# Patient Record
Sex: Male | Born: 1965
Health system: Southern US, Community
[De-identification: ages and names within clinical notes are randomized; demographics above are authoritative.]

## PROBLEM LIST (undated history)

## (undated) DIAGNOSIS — J309 Allergic rhinitis, unspecified: Secondary | ICD-10-CM

## (undated) DIAGNOSIS — R51 Headache: Secondary | ICD-10-CM

## (undated) DIAGNOSIS — Z860100 Personal history of colon polyps, unspecified: Secondary | ICD-10-CM

## (undated) DIAGNOSIS — H409 Unspecified glaucoma: Secondary | ICD-10-CM

## (undated) DIAGNOSIS — I10 Essential (primary) hypertension: Secondary | ICD-10-CM

## (undated) DIAGNOSIS — R519 Headache, unspecified: Secondary | ICD-10-CM

## (undated) HISTORY — DX: Personal history of colon polyps, unspecified: Z86.0100

## (undated) HISTORY — DX: Headache, unspecified: R51.9

## (undated) HISTORY — PX: CYST REMOVAL NECK: SHX6281

## (undated) HISTORY — DX: Essential (primary) hypertension: I10

## (undated) HISTORY — DX: Personal history of colonic polyps: Z86.010

## (undated) HISTORY — DX: Allergic rhinitis, unspecified: J30.9

## (undated) HISTORY — DX: Headache: R51

## (undated) HISTORY — DX: Unspecified glaucoma: H40.9

---

## 2000-12-20 ENCOUNTER — Ambulatory Visit (HOSPITAL_COMMUNITY): Admission: RE | Admit: 2000-12-20 | Discharge: 2000-12-20 | Payer: Self-pay | Admitting: Internal Medicine

## 2000-12-20 ENCOUNTER — Encounter: Payer: Self-pay | Admitting: Internal Medicine

## 2002-12-20 ENCOUNTER — Encounter: Payer: Self-pay | Admitting: Internal Medicine

## 2002-12-20 ENCOUNTER — Encounter: Admission: RE | Admit: 2002-12-20 | Discharge: 2002-12-20 | Payer: Self-pay | Admitting: Internal Medicine

## 2004-09-18 ENCOUNTER — Ambulatory Visit: Payer: Self-pay | Admitting: Internal Medicine

## 2005-02-08 ENCOUNTER — Ambulatory Visit: Payer: Self-pay | Admitting: Internal Medicine

## 2005-03-21 ENCOUNTER — Ambulatory Visit: Payer: Self-pay | Admitting: Endocrinology

## 2005-10-23 ENCOUNTER — Ambulatory Visit: Payer: Self-pay | Admitting: Endocrinology

## 2005-10-29 ENCOUNTER — Ambulatory Visit: Payer: Self-pay | Admitting: Endocrinology

## 2005-12-18 ENCOUNTER — Ambulatory Visit: Payer: Self-pay | Admitting: Endocrinology

## 2007-01-06 ENCOUNTER — Ambulatory Visit: Payer: Self-pay | Admitting: Endocrinology

## 2007-01-17 ENCOUNTER — Ambulatory Visit: Payer: Self-pay | Admitting: Endocrinology

## 2007-01-17 LAB — CONVERTED CEMR LAB
ALT: 36 units/L (ref 0–40)
AST: 29 units/L (ref 0–37)
Albumin: 4.2 g/dL (ref 3.5–5.2)
Alkaline Phosphatase: 59 units/L (ref 39–117)
BUN: 15 mg/dL (ref 6–23)
Basophils Absolute: 0 10*3/uL (ref 0.0–0.1)
Basophils Relative: 0.2 % (ref 0.0–1.0)
Bilirubin Urine: NEGATIVE
Bilirubin, Direct: 0.2 mg/dL (ref 0.0–0.3)
CO2: 31 meq/L (ref 19–32)
Calcium: 9.4 mg/dL (ref 8.4–10.5)
Chloride: 107 meq/L (ref 96–112)
Cholesterol: 140 mg/dL (ref 0–200)
Creatinine, Ser: 1 mg/dL (ref 0.4–1.5)
Eosinophils Absolute: 0.2 10*3/uL (ref 0.0–0.6)
Eosinophils Relative: 3.1 % (ref 0.0–5.0)
GFR calc Af Amer: 106 mL/min
GFR calc non Af Amer: 88 mL/min
Glucose, Bld: 102 mg/dL — ABNORMAL HIGH (ref 70–99)
HCT: 43.4 % (ref 39.0–52.0)
HDL: 41.3 mg/dL (ref >39.0)
Hemoglobin, Urine: NEGATIVE
Hemoglobin: 15.1 g/dL (ref 13.0–17.0)
Ketones, ur: NEGATIVE mg/dL
LDL Cholesterol: 80 mg/dL (ref 0–99)
Leukocytes, UA: NEGATIVE
Lymphocytes Relative: 47.9 % — ABNORMAL HIGH (ref 12.0–46.0)
MCHC: 34.8 g/dL (ref 30.0–36.0)
MCV: 88.1 fL (ref 78.0–100.0)
Monocytes Absolute: 0.6 10*3/uL (ref 0.2–0.7)
Monocytes Relative: 11.3 % — ABNORMAL HIGH (ref 3.0–11.0)
Neutro Abs: 2 10*3/uL (ref 1.4–7.7)
Neutrophils Relative %: 37.5 % — ABNORMAL LOW (ref 43.0–77.0)
Nitrite: NEGATIVE
Platelets: 425 10*3/uL — ABNORMAL HIGH (ref 150–400)
Potassium: 4 meq/L (ref 3.5–5.1)
RBC: 4.93 M/uL (ref 4.22–5.81)
RDW: 11.6 % (ref 11.5–14.6)
Sodium: 142 meq/L (ref 135–145)
Specific Gravity, Urine: 1.015 (ref 1.000–1.03)
TSH: 1.26 microintl units/mL (ref 0.35–5.50)
Total Bilirubin: 1 mg/dL (ref 0.3–1.2)
Total CHOL/HDL Ratio: 3.4
Total Protein, Urine: NEGATIVE mg/dL
Total Protein: 7.1 g/dL (ref 6.0–8.3)
Triglycerides: 93 mg/dL (ref 0–149)
Urine Glucose: NEGATIVE mg/dL
Urobilinogen, UA: 0.2 (ref 0.0–1.0)
VLDL: 19 mg/dL (ref 0–40)
WBC: 5.2 10*3/uL (ref 4.5–10.5)
pH: 8 (ref 5.0–8.0)

## 2007-01-24 ENCOUNTER — Ambulatory Visit: Payer: Self-pay | Admitting: Endocrinology

## 2007-02-18 ENCOUNTER — Encounter: Admission: RE | Admit: 2007-02-18 | Discharge: 2007-02-18 | Payer: Self-pay | Admitting: Endocrinology

## 2007-04-04 ENCOUNTER — Ambulatory Visit: Payer: Self-pay | Admitting: Internal Medicine

## 2007-04-04 HISTORY — PX: ELECTROCARDIOGRAM: SHX264

## 2007-05-01 ENCOUNTER — Ambulatory Visit: Payer: Self-pay | Admitting: Endocrinology

## 2007-05-01 ENCOUNTER — Encounter: Admission: RE | Admit: 2007-05-01 | Discharge: 2007-05-01 | Payer: Self-pay | Admitting: Endocrinology

## 2007-05-01 LAB — CONVERTED CEMR LAB
BUN: 11 mg/dL (ref 6–23)
Basophils Absolute: 0.1 10*3/uL (ref 0.0–0.1)
Basophils Relative: 1 % (ref 0.0–1.0)
CO2: 29 meq/L (ref 19–32)
Calcium: 9.9 mg/dL (ref 8.4–10.5)
Chloride: 106 meq/L (ref 96–112)
Creatinine, Ser: 1 mg/dL (ref 0.4–1.5)
Eosinophils Absolute: 0.2 10*3/uL (ref 0.0–0.6)
Eosinophils Relative: 3.5 % (ref 0.0–5.0)
GFR calc Af Amer: 106 mL/min
GFR calc non Af Amer: 88 mL/min
Glucose, Bld: 105 mg/dL — ABNORMAL HIGH (ref 70–99)
HCT: 41.5 % (ref 39.0–52.0)
Hemoglobin: 14.4 g/dL (ref 13.0–17.0)
Lymphocytes Relative: 44.7 % (ref 12.0–46.0)
MCHC: 34.8 g/dL (ref 30.0–36.0)
MCV: 89 fL (ref 78.0–100.0)
Monocytes Absolute: 0.9 10*3/uL — ABNORMAL HIGH (ref 0.2–0.7)
Monocytes Relative: 13.6 % — ABNORMAL HIGH (ref 3.0–11.0)
Neutro Abs: 2.3 10*3/uL (ref 1.4–7.7)
Neutrophils Relative %: 37.2 % — ABNORMAL LOW (ref 43.0–77.0)
Platelets: 313 10*3/uL (ref 150–400)
Potassium: 4.6 meq/L (ref 3.5–5.1)
RBC: 4.66 M/uL (ref 4.22–5.81)
RDW: 11.8 % (ref 11.5–14.6)
Sed Rate: 11 mm/hr (ref 0–20)
Sodium: 142 meq/L (ref 135–145)
WBC: 6.3 10*3/uL (ref 4.5–10.5)

## 2007-05-08 ENCOUNTER — Encounter: Payer: Self-pay | Admitting: Endocrinology

## 2007-05-08 DIAGNOSIS — J309 Allergic rhinitis, unspecified: Secondary | ICD-10-CM

## 2007-05-08 HISTORY — DX: Allergic rhinitis, unspecified: J30.9

## 2007-05-09 ENCOUNTER — Ambulatory Visit: Payer: Self-pay | Admitting: Internal Medicine

## 2008-02-03 ENCOUNTER — Ambulatory Visit: Payer: Self-pay | Admitting: Endocrinology

## 2008-02-09 ENCOUNTER — Encounter: Payer: Self-pay | Admitting: Endocrinology

## 2008-08-31 ENCOUNTER — Ambulatory Visit: Payer: Self-pay | Admitting: Internal Medicine

## 2008-08-31 DIAGNOSIS — R030 Elevated blood-pressure reading, without diagnosis of hypertension: Secondary | ICD-10-CM

## 2009-03-25 ENCOUNTER — Ambulatory Visit: Payer: Self-pay | Admitting: Endocrinology

## 2009-03-26 LAB — CONVERTED CEMR LAB
ALT: 27 units/L (ref 0–53)
AST: 24 units/L (ref 0–37)
Albumin: 4.3 g/dL (ref 3.5–5.2)
Alkaline Phosphatase: 58 units/L (ref 39–117)
BUN: 15 mg/dL (ref 6–23)
Basophils Absolute: 0 10*3/uL (ref 0.0–0.1)
Basophils Relative: 0 % (ref 0.0–3.0)
Bilirubin Urine: NEGATIVE
Bilirubin, Direct: 0.2 mg/dL (ref 0.0–0.3)
CO2: 29 meq/L (ref 19–32)
Calcium: 9.5 mg/dL (ref 8.4–10.5)
Chloride: 102 meq/L (ref 96–112)
Cholesterol: 171 mg/dL (ref 0–200)
Creatinine, Ser: 1.1 mg/dL (ref 0.4–1.5)
Eosinophils Absolute: 0.2 10*3/uL (ref 0.0–0.7)
Eosinophils Relative: 3.9 % (ref 0.0–5.0)
GFR calc non Af Amer: 77.62 mL/min (ref >60)
Glucose, Bld: 94 mg/dL (ref 70–99)
HCT: 44.7 % (ref 39.0–52.0)
HDL: 50.8 mg/dL (ref >39.00)
Hemoglobin, Urine: NEGATIVE
Hemoglobin: 15.8 g/dL (ref 13.0–17.0)
Ketones, ur: NEGATIVE mg/dL
LDL Cholesterol: 98 mg/dL (ref 0–99)
Leukocytes, UA: NEGATIVE
Lymphocytes Relative: 41.3 % (ref 12.0–46.0)
Lymphs Abs: 2.1 10*3/uL (ref 0.7–4.0)
MCHC: 35.3 g/dL (ref 30.0–36.0)
MCV: 88.5 fL (ref 78.0–100.0)
Monocytes Absolute: 0.7 10*3/uL (ref 0.1–1.0)
Monocytes Relative: 13.6 % — ABNORMAL HIGH (ref 3.0–12.0)
Neutro Abs: 2.1 10*3/uL (ref 1.4–7.7)
Neutrophils Relative %: 41.2 % — ABNORMAL LOW (ref 43.0–77.0)
Nitrite: NEGATIVE
PSA: 0.52 ng/mL (ref 0.10–4.00)
Platelets: 271 10*3/uL (ref 150.0–400.0)
Potassium: 4 meq/L (ref 3.5–5.1)
RBC: 5.05 M/uL (ref 4.22–5.81)
RDW: 11.5 % (ref 11.5–14.6)
Sodium: 138 meq/L (ref 135–145)
Specific Gravity, Urine: 1.015 (ref 1.000–1.030)
TSH: 1.46 microintl units/mL (ref 0.35–5.50)
Total Bilirubin: 1.5 mg/dL — ABNORMAL HIGH (ref 0.3–1.2)
Total CHOL/HDL Ratio: 3
Total Protein, Urine: NEGATIVE mg/dL
Total Protein: 7.6 g/dL (ref 6.0–8.3)
Triglycerides: 113 mg/dL (ref 0.0–149.0)
Urine Glucose: NEGATIVE mg/dL
Urobilinogen, UA: 0.2 (ref 0.0–1.0)
VLDL: 22.6 mg/dL (ref 0.0–40.0)
WBC: 5.1 10*3/uL (ref 4.5–10.5)
pH: 6 (ref 5.0–8.0)

## 2009-04-01 ENCOUNTER — Ambulatory Visit: Payer: Self-pay | Admitting: Endocrinology

## 2009-04-01 DIAGNOSIS — H409 Unspecified glaucoma: Secondary | ICD-10-CM | POA: Insufficient documentation

## 2009-04-01 HISTORY — DX: Unspecified glaucoma: H40.9

## 2009-04-29 ENCOUNTER — Emergency Department (HOSPITAL_COMMUNITY): Admission: EM | Admit: 2009-04-29 | Discharge: 2009-04-29 | Payer: Self-pay | Admitting: Emergency Medicine

## 2009-08-15 ENCOUNTER — Encounter: Payer: Self-pay | Admitting: Endocrinology

## 2009-11-01 ENCOUNTER — Ambulatory Visit: Payer: Self-pay | Admitting: Internal Medicine

## 2009-11-01 LAB — CONVERTED CEMR LAB
Ketones, ur: NEGATIVE mg/dL
Leukocytes, UA: NEGATIVE
Specific Gravity, Urine: 1.02 (ref 1.000–1.030)
Urine Glucose: NEGATIVE mg/dL
Urobilinogen, UA: 0.2 (ref 0.0–1.0)
pH: 6.5 (ref 5.0–8.0)

## 2009-11-16 ENCOUNTER — Ambulatory Visit: Payer: Self-pay | Admitting: Internal Medicine

## 2009-11-16 LAB — CONVERTED CEMR LAB: Rapid Strep: NEGATIVE

## 2010-04-11 ENCOUNTER — Ambulatory Visit: Payer: Self-pay | Admitting: Endocrinology

## 2010-04-11 LAB — CONVERTED CEMR LAB
Basophils Absolute: 0.1 10*3/uL (ref 0.0–0.1)
Bilirubin Urine: NEGATIVE
Bilirubin, Direct: 0.1 mg/dL (ref 0.0–0.3)
CO2: 27 meq/L (ref 19–32)
Calcium: 9.4 mg/dL (ref 8.4–10.5)
Chloride: 108 meq/L (ref 96–112)
Cholesterol: 186 mg/dL (ref 0–200)
Creatinine, Ser: 1 mg/dL (ref 0.4–1.5)
Eosinophils Absolute: 0.3 10*3/uL (ref 0.0–0.7)
HCT: 45.6 % (ref 39.0–52.0)
HDL: 44 mg/dL (ref >39.00)
Leukocytes, UA: NEGATIVE
Lymphs Abs: 2.6 10*3/uL (ref 0.7–4.0)
MCV: 90.4 fL (ref 78.0–100.0)
Monocytes Absolute: 0.8 10*3/uL (ref 0.1–1.0)
Neutrophils Relative %: 35.8 % — ABNORMAL LOW (ref 43.0–77.0)
Nitrite: NEGATIVE
PSA: 0.57 ng/mL (ref 0.10–4.00)
Platelets: 294 10*3/uL (ref 150.0–400.0)
RDW: 12.4 % (ref 11.5–14.6)
Sodium: 139 meq/L (ref 135–145)
Specific Gravity, Urine: >=1.03 (ref 1.000–1.030)
TSH: 2.36 microintl units/mL (ref 0.35–5.50)
Total Bilirubin: 0.6 mg/dL (ref 0.3–1.2)
Total Protein, Urine: NEGATIVE mg/dL
Triglycerides: 288 mg/dL — ABNORMAL HIGH (ref 0.0–149.0)
WBC: 6 10*3/uL (ref 4.5–10.5)
pH: 5.5 (ref 5.0–8.0)

## 2010-04-13 ENCOUNTER — Ambulatory Visit: Payer: Self-pay | Admitting: Endocrinology

## 2010-11-09 NOTE — Assessment & Plan Note (Signed)
Summary: CPX/ [facility]   NEEDED IT BEFORE 7-8 /[facility]   Vital Signs:  Patient profile:   46 year old male Height:      73 inches (185.42 cm) Weight:      194.38 pounds (88.35 kg) BMI:     25.74 O2 Sat:      95 % on Room air Temp:     97.1 degrees F (36.17 degrees C) oral Pulse rate:   53 / minute BP sitting:   124 / 84  (left arm) Cuff size:   large  Vitals Entered By: Brenton Grills MA (April 13, 2010 8:01 AM)  O2 Flow:  Room air CC: CPX/pt has form to be filled out/aj   Primary Provider:  Minus Breeding MD  CC:  CPX/pt has form to be filled out/aj.  History of Present Illness: here for regular wellness examination.  He's feeling pretty well in general, and does not smoke.  alcohol is 1-2/day, but not every day.   Current Medications (verified): 1)  Travatan 0.004 %  Soln (Travoprost) .... Use 1 Drop in Each Eye Qd 2)  Claritin 10 Mg  Tabs (Loratadine) .... Take 1 By Mouth Qd  Allergies (verified): No Known Drug Allergies  Family History: Reviewed history from 08/31/2008 and no changes required. sister died with leukemia father with CABG at 21 yo  Social History: Reviewed history from 08/31/2008 and no changes required. Never Smoked Alcohol use-yes Married work - Hydrologist) - for [employer]  Review of Systems  The patient denies fever, weight loss, weight gain, vision loss, decreased hearing, chest pain, syncope, dyspnea on exertion, prolonged cough, headaches, abdominal pain, melena, hematochezia, severe indigestion/heartburn, hematuria, suspicious skin lesions, and depression.         denies decreased urinary stream  Physical Exam  General:  normal appearance.   Head:  head: no deformity eyes: no periorbital swelling, no proptosis external nose and ears are normal mouth: no lesion seen Neck:  Supple without thyroid enlargement or tenderness.  Lungs:  Clear to auscultation bilaterally. Normal respiratory effort.  Heart:  Regular rate and rhythm without  murmurs or gallops noted. Normal S1,S2.   Abdomen:  abdomen is soft, nontender.  no hepatosplenomegaly.   not distended.  no hernia  Genitalia:  Normal external male genitalia with no urethral discharge.  Msk:  muscle bulk and strength are grossly normal.  no obvious joint swelling.  gait is normal and steady  Pulses:  dorsalis pedis intact bilat.  no carotid bruit Extremities:  no deformity.  no ulcer on the feet.  feet are of normal color and temp.  no edema  Neurologic:  cn 2-12 grossly intact.   readily moves all 4's.   sensation is intact to touch on the feet  Skin:  normal texture and temp.  no rash.  not diaphoretic  Cervical Nodes:  No significant adenopathy.  Psych:  Alert and cooperative; normal mood and affect; normal attention span and concentration.     Impression & Recommendations:  Problem # 1:  ROUTINE GENERAL MEDICAL EXAM@HEALTH  CARE FACL (ICD-V70.0)  Other Orders: Est. Patient 40-64 years (16109)  Patient Instructions: 1)  please consider these measures for your health:  minimize alcohol.  do not use tobacco products.  have a colonoscopy at least every 10 years from age 16.  keep firearms safely stored.  always use seat belts.  have working smoke alarms in your home.  see the dentist regularly.  never drive under the influence  of alcohol or drugs (including prescription drugs).  those with fair skin should take precautions against the sun.

## 2010-11-09 NOTE — Assessment & Plan Note (Signed)
Summary: flu symptoms-lb   Vital Signs:  Patient profile:   45 year old male Height:      73 inches (185.42 cm) Weight:      195 pounds (88.64 kg) O2 Sat:      98 % on Room air Temp:     98.2 degrees F (36.78 degrees C) oral Pulse rate:   77 / minute BP sitting:   120 / 82  (left arm) Cuff size:   large  Vitals Entered By: Orlan Leavens (November 16, 2009 1:04 PM)  O2 Flow:  Room air CC: flu like symtoms Is Patient Diabetic? No Pain Assessment Patient in pain? no        Primary Care Provider:  Minus Breeding MD  CC:  flu like symtoms.  History of Present Illness: here today with complaint of fever and body ache. onset of symptoms was 36h ago (started [employer]). course has been gradual onset and now occurs with rapidly progressing pattern. problem precipitated by "tickle" in throat end of day Monday symptom characterized now as head congestion and fatigue - home early yesterday problem associated with sneezing, myalgias or high fever (102)  but not associated with sore throat, severe headache or rash. symptoms improved by use of tylenol and Sudafed. no prior hx of same symptoms.  took flu shot this year - son dx with strep throat this AM (rapid strep)  Current Medications (verified): 1)  Travatan 0.004 %  Soln (Travoprost) .... Use 1 Drop in Each Eye Qd 2)  Claritin 10 Mg  Tabs (Loratadine) .... Take 1 By Mouth Qd  Allergies (verified): No Known Drug Allergies  Past History:  Past Medical History: Reviewed history from 05/08/2007 and no changes required. Allergic rhinitis  Review of Systems       The patient complains of fever.  The patient denies hoarseness, chest pain, and syncope.    Physical Exam  General:  alert, well-developed, well-nourished, and cooperative to examination.   mildly ill Eyes:  vision grossly intact; pupils equal, round and reactive to light - watery conjunctiva but lids normal.    Ears:  normal pinnae bilaterally, without erythema,  swelling, or tenderness to palpation. TMs clear, without effusion, or cerumen impaction. Hearing grossly normal bilaterally  Mouth:  teeth and gums in good repair; mucous membranes moist, without lesions or ulcers. oropharynx clear without exudate, mod erythema.  Lungs:  normal respiratory effort, no intercostal retractions or use of accessory muscles; normal breath sounds bilaterally - no crackles and no wheezes.    Heart:  normal rate, regular rhythm, no murmur, and no rub. BLE without edema.  Neurologic:  alert & oriented X3 and cranial nerves II-XII symetrically intact.  strength normal in all extremities, sensation intact to light touch, and gait normal. speech fluent without dysarthria or aphasia; follows commands with good comprehension.  Skin:  no rashes, vesicles, ulcers, or erythema. No nodules or irregularity to palpation.    Impression & Recommendations:  Problem # 1:  FEVER UNSPECIFIED (ICD-780.60)  likely related to viral syndrome - see next-- due to +close family contacts with +strep - will check rapid strep to ensure no need for antibacterial--> NEG  Discussed fever control and symptomatic treatment.   Orders: Rapid Strep (54098)  Problem # 2:  URI (ICD-465.9)  ?flu - recieved vaccination against same but +typical symptoms and high fever - will tx emperically for flu with tamiflu cont tylenol/sudafed as needed for symptoms mgmt  His updated medication list for  this problem includes:    Claritin 10 Mg Tabs (Loratadine) .Marland Kitchen... Take 1 by mouth qd  Instructed on symptomatic treatment. Call if symptoms persist or worsen.   Complete Medication List: 1)  Travatan 0.004 % Soln (Travoprost) .... Use 1 drop in each eye qd 2)  Claritin 10 Mg Tabs (Loratadine) .... Take 1 by mouth qd 3)  Tamiflu 75 Mg Caps (Oseltamivir phosphate) .Marland Kitchen.. 1 by mouth two times a day x 5days  Patient Instructions: 1)  it was good to see you today.  2)  rapid strep is negative 3)  take tamiflu two  times a day x next 5 days for possible flu - may also continue with tylenol and sudafed as needed for other fever and congestion symptoms  4)  Get plenty of rest, drink lots of clear liquids, and use Tylenol for fever and comfort. Return in 7-10 days if you're not better,sooner if you're feeling worse. Prescriptions: TAMIFLU 75 MG CAPS (OSELTAMIVIR PHOSPHATE) 1 by mouth two times a day x 5days  #10 x 0   Entered and Authorized by:   Newt Lukes MD   Signed by:   Newt Lukes MD on 11/16/2009   Method used:   Print then Give to Patient   RxID:   [id number]   Laboratory Results    Other Tests  Rapid Strep: negative

## 2010-11-09 NOTE — Progress Notes (Signed)
Summary: Physical Exam Form/[facility] (patient)  Physical Exam Form/[facility] (patient)   Imported By: Sherian Rein 04/21/2010 08:43:32  _____________________________________________________________________  External Attachment:    Type:   Image     Comment:   External Document

## 2010-11-09 NOTE — Assessment & Plan Note (Signed)
Summary: AFTER EXERCISING GROIN PAIN--DR SAE PT/NO SLOT--STC   Vital Signs:  Patient profile:   45 year old male Height:      73 inches Weight:      195 pounds BMI:     25.82 O2 Sat:      97 % on Room air Temp:     97.1 degrees F oral Pulse rate:   58 / minute BP sitting:   112 / 72  (left arm) Cuff size:   regular  Vitals Entered ByZella Ball Ewing (November 01, 2009 3:55 PM)  O2 Flow:  Room air  CC: groin pain when exercising/RE   CC:  groin pain when exercising/RE.  History of Present Illness: here with dull pain recurrent to the perineal area just post to the scrotum for 4 wks intermittent only after significant excercise such as basketball or jogging  2 miles , which he does several times per wk.  No abd pain, n/v, fever, GU symptoms such as freq, urgency, blood or dysuria, or GI such as constipation or diarrhea or blood;  no analrectal pain.  Pain will typically wane over 2 days and seems better with ice after excercise and 2 alleve.  No prior hx of this in the past, including no hx of prostatitis.  no recent back pain, wt loss, night sweats.    Problems Prior to Update: 1)  Pelvic Pain  (ICD-789.09) 2)  Routine General Medical Exam@health  Care Facl  (ICD-V70.0) 3)  Glaucoma  (ICD-365.9) 4)  Elevated Blood Pressure Without Diagnosis of Hypertension  (ICD-796.2) 5)  Foot Pain, Right  (ICD-729.5) 6)  Allergic Rhinitis  (ICD-477.9)  Medications Prior to Update: 1)  Travatan 0.004 %  Soln (Travoprost) .... Use 1 Drop in Each Eye Qd 2)  Claritin 10 Mg  Tabs (Loratadine) .... Take 1 By Mouth Qd  Current Medications (verified): 1)  Travatan 0.004 %  Soln (Travoprost) .... Use 1 Drop in Each Eye Qd 2)  Claritin 10 Mg  Tabs (Loratadine) .... 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   Head:  normocephalic and atraumatic.   Eyes:  vision grossly intact, pupils equal, and pupils round.   Ears:  R ear normal and L ear normal.   Nose:  no external deformity and no nasal discharge.   Mouth:  no gingival abnormalities and pharynx pink and moist.   Neck:  supple and no masses.   Lungs:  normal respiratory effort and normal breath sounds.   Heart:  normal rate and regular rhythm.   Abdomen:  soft, non-tender, and normal bowel sounds.   Rectal:  No external abnormalities noted. Normal sphincter tone. No rectal masses or tenderness.  No perineal tender, erythema or swelling. Genitalia:  Testes bilaterally descended without nodularity, tenderness or masses. No scrotal masses or lesions. No penis lesions or urethral discharge.   Impression & Recommendations:  Problem # 1:  PELVIC  PAIN (ICD-789.09)  prob msk strain (pelvic floor muscles) recurrent by hx and benign exam, will check urine studies but exam bening, ok  for alleve OTC as he does as needed ; f/u for any worsening s/s  Orders: T-Culture, Urine ([id number]) TLB-Udip w/ Micro (81001-URINE)  Complete Medication List: 1)  Travatan 0.004 % Soln (Travoprost) .... Use 1 drop in each eye qd 2)  Claritin 10 Mg Tabs (Loratadine) .... Take 1 by mouth qd  Patient Instructions: 1)  Please go to the Lab in the basement for your urine tests today  2)  Continue all previous medications as before this visit  3)  Please schedule a follow-up appointment as needed.

## 2010-12-01 ENCOUNTER — Ambulatory Visit: Payer: Self-pay | Admitting: Family Medicine

## 2010-12-07 ENCOUNTER — Ambulatory Visit: Payer: Self-pay | Admitting: Endocrinology

## 2011-02-20 NOTE — Assessment & Plan Note (Signed)
[facility]                                 ON-CALL NOTE   Micheal Lawson, Micheal Lawson                       MRN:          [id number]  DATE:05/01/2007                            DOB:          Oct 27, 1965    TIME OF CALL:  May 01, 2007 at 5:36 p.m.   PHONE NUMBER:  [phone]   CALLER:  Gene at [facility].   OBJECTIVE:  The patient had a brain scan done.  Has had a constant  headache for 2 days.  This scan was read as normal.  The office will get  a copy of this dictation.  I presume that they will call the patient as  I do not have his phone number or identification.   PRIMARY CARE Rumaisa Schnetzer:  Dr. Everardo All.  Home office is [facility].     Arta Silence, MD  Electronically Signed    RNS/MedQ  DD: 05/01/2007  DT: 05/02/2007  Job #: [id number]

## 2012-02-22 ENCOUNTER — Ambulatory Visit (INDEPENDENT_AMBULATORY_CARE_PROVIDER_SITE_OTHER): Payer: Self-pay | Admitting: Endocrinology

## 2012-02-22 ENCOUNTER — Encounter: Payer: Self-pay | Admitting: Endocrinology

## 2012-02-22 VITALS — BP 142/94 | HR 69 | Temp 97.6°F | Ht 73.0 in | Wt 200.0 lb

## 2012-02-22 DIAGNOSIS — Z0000 Encounter for general adult medical examination without abnormal findings: Secondary | ICD-10-CM | POA: Insufficient documentation

## 2012-02-22 DIAGNOSIS — R030 Elevated blood-pressure reading, without diagnosis of hypertension: Secondary | ICD-10-CM

## 2012-02-22 DIAGNOSIS — Z125 Encounter for screening for malignant neoplasm of prostate: Secondary | ICD-10-CM

## 2012-02-22 NOTE — Patient Instructions (Addendum)
please consider these measures for your health:  minimize alcohol.  do not use tobacco products.  have a colonoscopy at least every 10 years from age 46.  keep firearms safely stored.  always use seat belts.  have working smoke alarms in your home.  see an eye doctor and dentist regularly.  never drive under the influence of alcohol or drugs (including prescription drugs).  those with fair skin should take precautions against the sun.   blood tests are being requested for you today.  You will receive a letter with results.   Please come back for a blood-pressure check in the next few weeks.

## 2012-02-22 NOTE — Progress Notes (Signed)
Subjective:    Patient ID: Micheal Lawson, male    DOB: [birth year], 46 y.o.   MRN: [id number]  HPI here for regular wellness examination.  He's feeling pretty well in general, and says chronic med probs are stable. Past Medical History  Diagnosis Date  . ALLERGIC RHINITIS 05/08/2007    Qualifier: Diagnosis of  By: Charlsie Quest RMA, [name]    . ELEVATED BLOOD PRESSURE WITHOUT DIAGNOSIS OF HYPERTENSION 08/31/2008    Qualifier: Diagnosis of  By: Jonny Ruiz MD, Len Blalock   . GLAUCOMA 04/01/2009    Qualifier: Diagnosis of  By: Everardo All MD, Cleophas Dunker     Past Surgical History  Procedure Date  . Electrocardiogram 04/04/2007    History   Social History  . Marital Status: Married    Spouse Name: N/A    Number of Children: N/A  . Years of Education: N/A   Occupational History  . IT Research scientist (physical sciences)) for [employer]   Social History Main Topics  . Smoking status: Never Smoker   . Smokeless tobacco: Not on file  . Alcohol Use: Yes  . Drug Use: Not on file  . Sexually Active: Not on file   Other Topics Concern  . Not on file   Social History Narrative  . No narrative on file    Current Outpatient Prescriptions on File Prior to Visit  Medication Sig Dispense Refill  . cetirizine (ZYRTEC) 10 MG tablet Take 10 mg by mouth daily.        No Known Allergies  Family History  Problem Relation Age of Onset  . Heart disease Father     CABG 28  . Leukemia Sister     BP 142/94  Pulse 69  Temp(Src) 97.6 F (36.4 C) (Oral)  Ht 6\' 1"  (1.854 m)  Wt 200 lb (90.719 kg)  BMI 26.39 kg/m2  SpO2 97%     Review of Systems  Constitutional: Negative for fever and unexpected weight change.  HENT: Negative for hearing loss.   Eyes: Negative for visual disturbance.  Respiratory: Negative for shortness of breath.   Cardiovascular: Negative for chest pain.  Gastrointestinal: Negative for abdominal pain and anal bleeding.  Genitourinary: Negative for hematuria and difficulty urinating.  Musculoskeletal:  Negative for back pain.  Skin: Negative for rash.  Neurological: Negative for syncope and headaches.  Hematological: Does not bruise/bleed easily.  Psychiatric/Behavioral: Negative for dysphoric mood.       Objective:   Physical Exam VS: see vs page GEN: no distress HEAD: head: no deformity eyes: no periorbital swelling, no proptosis external nose and ears are normal mouth: no lesion seen NECK: supple, thyroid is not enlarged CHEST WALL: no deformity LUNGS: clear to auscultation BREASTS:  No gynecomastia CV: reg rate and rhythm, no murmur ABD: abdomen is soft, nontender.  no hepatosplenomegaly.  not distended.  no hernia GENITALIA:  Normal male.   MUSCULOSKELETAL: muscle bulk and strength are grossly normal.  no obvious joint swelling.  gait is normal and steady EXTEMITIES: no deformity.  no ulcer on the feet.  feet are of normal color and temp.  no edema PULSES: dorsalis pedis intact bilat.  no carotid bruit NEURO:  cn 2-12 grossly intact.   readily moves all 4's.  sensation is intact to touch on the feet SKIN:  Normal texture and temperature.  No rash or suspicious lesion is visible.   NODES:  None palpable at the neck PSYCH: alert, oriented x3.  Does not appear anxious nor depressed.  Assessment & Plan:  Wellness visit today, with problems stable  1 skin tag removed from right shoulder area

## 2012-02-26 ENCOUNTER — Other Ambulatory Visit (INDEPENDENT_AMBULATORY_CARE_PROVIDER_SITE_OTHER): Payer: 59

## 2012-02-26 DIAGNOSIS — Z0000 Encounter for general adult medical examination without abnormal findings: Secondary | ICD-10-CM

## 2012-02-26 DIAGNOSIS — Z125 Encounter for screening for malignant neoplasm of prostate: Secondary | ICD-10-CM

## 2012-02-26 DIAGNOSIS — R030 Elevated blood-pressure reading, without diagnosis of hypertension: Secondary | ICD-10-CM

## 2012-02-26 LAB — LIPID PANEL
HDL: 34.9 mg/dL — ABNORMAL LOW (ref >39.00)
LDL Cholesterol: 71 mg/dL (ref 0–99)
Total CHOL/HDL Ratio: 4
Triglycerides: 88 mg/dL (ref 0.0–149.0)
VLDL: 17.6 mg/dL (ref 0.0–40.0)

## 2012-02-26 LAB — URINALYSIS, ROUTINE W REFLEX MICROSCOPIC
Bilirubin Urine: NEGATIVE
Ketones, ur: NEGATIVE
Leukocytes, UA: NEGATIVE
pH: 5.5 (ref 5.0–8.0)

## 2012-02-26 LAB — CBC WITH DIFFERENTIAL/PLATELET
Basophils Relative: 0.6 % (ref 0.0–3.0)
Eosinophils Absolute: 0.3 10*3/uL (ref 0.0–0.7)
Eosinophils Relative: 5.4 % — ABNORMAL HIGH (ref 0.0–5.0)
Hemoglobin: 14.9 g/dL (ref 13.0–17.0)
Lymphocytes Relative: 39.1 % (ref 12.0–46.0)
MCHC: 34 g/dL (ref 30.0–36.0)
Monocytes Relative: 17.9 % — ABNORMAL HIGH (ref 3.0–12.0)
Neutro Abs: 1.9 10*3/uL (ref 1.4–7.7)
RBC: 4.89 Mil/uL (ref 4.22–5.81)

## 2012-02-26 LAB — PSA: PSA: 0.6 ng/mL (ref 0.10–4.00)

## 2012-02-26 LAB — BASIC METABOLIC PANEL
CO2: 25 mEq/L (ref 19–32)
Calcium: 8.9 mg/dL (ref 8.4–10.5)
Sodium: 139 mEq/L (ref 135–145)

## 2012-02-26 LAB — HEPATIC FUNCTION PANEL
Albumin: 4 g/dL (ref 3.5–5.2)
Alkaline Phosphatase: 64 U/L (ref 39–117)

## 2012-02-26 LAB — TSH: TSH: 2.85 u[IU]/mL (ref 0.35–5.50)

## 2012-02-27 ENCOUNTER — Encounter: Payer: Self-pay | Admitting: Endocrinology

## 2012-02-28 ENCOUNTER — Telehealth: Payer: Self-pay | Admitting: *Deleted

## 2012-02-28 NOTE — Telephone Encounter (Signed)
Called pt to inform of lab results, left message for pt to callback office (letter also mailed to pt). 

## 2012-03-04 NOTE — Telephone Encounter (Signed)
Left VM informing pt of results and to callback office with any questions/concerns.

## 2012-08-08 ENCOUNTER — Ambulatory Visit (INDEPENDENT_AMBULATORY_CARE_PROVIDER_SITE_OTHER): Payer: 59 | Admitting: Endocrinology

## 2012-08-08 ENCOUNTER — Encounter: Payer: Self-pay | Admitting: Endocrinology

## 2012-08-08 ENCOUNTER — Ambulatory Visit
Admission: RE | Admit: 2012-08-08 | Discharge: 2012-08-08 | Disposition: A | Discharge status: Home / Self Care | Payer: 59 | Source: Ambulatory Visit | Attending: Endocrinology | Admitting: Endocrinology

## 2012-08-08 VITALS — BP 122/80 | HR 80 | Temp 98.2°F | Wt 192.0 lb

## 2012-08-08 DIAGNOSIS — M25511 Pain in right shoulder: Secondary | ICD-10-CM

## 2012-08-08 DIAGNOSIS — M25519 Pain in unspecified shoulder: Secondary | ICD-10-CM

## 2012-08-08 NOTE — Patient Instructions (Addendum)
Refer to an orthopedic specialist.  you will receive a phone call, about a day and time for an appointment.   Let's check a x-ray.  You will be contacted with results.

## 2012-08-08 NOTE — Progress Notes (Signed)
  Subjective:    Patient ID: Micheal Lawson, male    DOB: 03-16-1966, 46 y.o.   MRN: [id number]  HPI Pt states 8 mos of intermittent moderate pain at the right shoulder.  He has assoc pain at the right forearm.  He is unable to cite precip factor, such as local injury.   Past Medical History  Diagnosis Date  . ALLERGIC RHINITIS 05/08/2007    Qualifier: Diagnosis of  By: Charlsie Quest RMA, [name]    . ELEVATED BLOOD PRESSURE WITHOUT DIAGNOSIS OF HYPERTENSION 08/31/2008    Qualifier: Diagnosis of  By: Jonny Ruiz MD, Len Blalock   . GLAUCOMA 04/01/2009    Qualifier: Diagnosis of  By: Everardo All MD, Cleophas Dunker     Past Surgical History  Procedure Date  . Electrocardiogram 04/04/2007    History   Social History  . Marital Status: Married    Spouse Name: N/A    Number of Children: N/A  . Years of Education: N/A   Occupational History  . IT Research scientist (physical sciences)) for [employer]   Social History Main Topics  . Smoking status: Never Smoker   . Smokeless tobacco: Not on file  . Alcohol Use: Yes  . Drug Use: Not on file  . Sexually Active: Not on file   Other Topics Concern  . Not on file   Social History Narrative  . No narrative on file    Current Outpatient Prescriptions on File Prior to Visit  Medication Sig Dispense Refill  . Bromfenac Sodium (BROMDAY) 0.09 % SOLN Apply 1 drop to eye 2 (two) times daily.      . cetirizine (ZYRTEC) 10 MG tablet Take 10 mg by mouth daily.      . travoprost, benzalkonium, (TRAVATAN) 0.004 % ophthalmic solution Place 1 drop into both eyes 2 (two) times daily.        No Known Allergies  Family History  Problem Relation Age of Onset  . Heart disease Father     CABG 37  . Leukemia Sister     BP 122/80  Pulse 80  Temp 98.2 F (36.8 C) (Oral)  Wt 192 lb (87.091 kg)  SpO2 98%  Review of Systems Denies numbness and rash.      Objective:   Physical Exam VITAL SIGNS:  See vs page GENERAL: no distress Right shoulder: full rom without pain, except reaching  posteriorly is painful.   RUE: otherwise normal.  Neuro: sensation is intact to touch.     (i reviewed x-ray result)    Assessment & Plan:  Shoulder pain, new, uncertain etiology

## 2012-09-08 ENCOUNTER — Other Ambulatory Visit (HOSPITAL_COMMUNITY): Payer: Self-pay | Admitting: Orthopedic Surgery

## 2012-09-08 DIAGNOSIS — M25511 Pain in right shoulder: Secondary | ICD-10-CM

## 2012-09-10 ENCOUNTER — Ambulatory Visit (HOSPITAL_COMMUNITY)
Admission: RE | Admit: 2012-09-10 | Discharge: 2012-09-10 | Disposition: A | Discharge status: Home / Self Care | Payer: 59 | Source: Ambulatory Visit | Attending: Orthopedic Surgery | Admitting: Orthopedic Surgery

## 2012-09-10 DIAGNOSIS — M719 Bursopathy, unspecified: Secondary | ICD-10-CM | POA: Insufficient documentation

## 2012-09-10 DIAGNOSIS — M67919 Unspecified disorder of synovium and tendon, unspecified shoulder: Secondary | ICD-10-CM | POA: Insufficient documentation

## 2012-09-10 DIAGNOSIS — M25511 Pain in right shoulder: Secondary | ICD-10-CM

## 2012-09-10 DIAGNOSIS — M19019 Primary osteoarthritis, unspecified shoulder: Secondary | ICD-10-CM | POA: Insufficient documentation

## 2012-09-10 DIAGNOSIS — M25519 Pain in unspecified shoulder: Secondary | ICD-10-CM | POA: Insufficient documentation

## 2012-09-16 ENCOUNTER — Encounter (HOSPITAL_COMMUNITY): Payer: Self-pay | Admitting: Pharmacy Technician

## 2012-09-18 ENCOUNTER — Other Ambulatory Visit: Payer: Self-pay | Admitting: Physician Assistant

## 2012-09-22 NOTE — Pre-Procedure Instructions (Signed)
[name]  09/22/2012   Your procedure is scheduled on:  09-26-2012  Report to [facility] at 8:00 AM.  Take Charlotta Newton to the 3rd floor  Call this number if you have problems the morning of surgery: [phone]   Remember:   Do not eat food or drink:After Midnight.      Take these medicines the morning of surgery with A SIP OF WATER: eye drops as directed,certirizine(Zyrtec)   Do not wear jewelry,  Do not wear lotions, powders, or perfumes.  Do not shave 48 hours prior to surgery. Men may shave face and neck.  Do not bring valuables to the hospital.  Contacts, dentures or bridgework may not be worn into surgery.  Leave suitcase in the car. After surgery it may be brought to your room.   For patients admitted to the hospital, checkout time is 11:00 AM the day of discharge.   Patients discharged the day of surgery will not be allowed to drive home.  Name and phone number of your driver: ______________________   Special Instructions: Shower using CHG 2 nights before surgery and the night before surgery.  If you shower the day of surgery use CHG.  Use special wash - you have one bottle of CHG for all showers.  You should use approximately 1/3 of the bottle for each shower.    Please read over the following fact sheets that you were given: Pain Booklet, Coughing and Deep Breathing, MRSA Information and Surgical Site Infection Prevention

## 2012-09-23 ENCOUNTER — Encounter (HOSPITAL_COMMUNITY): Payer: Self-pay

## 2012-09-23 ENCOUNTER — Encounter (HOSPITAL_COMMUNITY)
Admission: RE | Admit: 2012-09-23 | Discharge: 2012-09-23 | Disposition: A | Discharge status: Home / Self Care | Payer: 59 | Source: Ambulatory Visit | Attending: Orthopedic Surgery | Admitting: Orthopedic Surgery

## 2012-09-23 LAB — CBC
MCHC: 35.7 g/dL (ref 30.0–36.0)
RDW: 11.9 % (ref 11.5–15.5)

## 2012-09-23 LAB — BASIC METABOLIC PANEL WITH GFR
BUN: 13 mg/dL (ref 6–23)
CO2: 25 meq/L (ref 19–32)
Calcium: 9.7 mg/dL (ref 8.4–10.5)
Chloride: 105 meq/L (ref 96–112)
Creatinine, Ser: 0.95 mg/dL (ref 0.50–1.35)
GFR calc Af Amer: >90 mL/min
GFR calc non Af Amer: >90 mL/min
Glucose, Bld: 88 mg/dL (ref 70–99)
Potassium: 4 meq/L (ref 3.5–5.1)
Sodium: 140 meq/L (ref 135–145)

## 2012-09-23 LAB — SURGICAL PCR SCREEN
MRSA, PCR: NEGATIVE
Staphylococcus aureus: NEGATIVE

## 2012-09-23 MED ORDER — SODIUM CHLORIDE 0.9 % IV SOLN: INTRAVENOUS | Status: DC

## 2012-09-25 MED ORDER — CEFAZOLIN SODIUM-DEXTROSE 2-3 GM-% IV SOLR
2.0000 g | INTRAVENOUS | Status: AC
  Administered 2012-09-26: 2 g via INTRAVENOUS
  Filled 2012-09-25: qty 50

## 2012-09-26 ENCOUNTER — Ambulatory Visit (HOSPITAL_COMMUNITY)
Admission: RE | Admit: 2012-09-26 | Discharge: 2012-09-26 | Disposition: A | Discharge status: Home / Self Care | Payer: 59 | Source: Ambulatory Visit | Attending: Orthopedic Surgery | Admitting: Orthopedic Surgery

## 2012-09-26 ENCOUNTER — Encounter (HOSPITAL_COMMUNITY)
Admission: RE | Disposition: A | Discharge status: Home / Self Care | Payer: Self-pay | Source: Ambulatory Visit | Attending: Orthopedic Surgery

## 2012-09-26 ENCOUNTER — Encounter (HOSPITAL_COMMUNITY): Payer: Self-pay | Admitting: Anesthesiology

## 2012-09-26 ENCOUNTER — Ambulatory Visit (HOSPITAL_COMMUNITY): Payer: 59 | Admitting: Anesthesiology

## 2012-09-26 DIAGNOSIS — J309 Allergic rhinitis, unspecified: Secondary | ICD-10-CM | POA: Insufficient documentation

## 2012-09-26 DIAGNOSIS — Z806 Family history of leukemia: Secondary | ICD-10-CM | POA: Insufficient documentation

## 2012-09-26 DIAGNOSIS — Z7982 Long term (current) use of aspirin: Secondary | ICD-10-CM | POA: Insufficient documentation

## 2012-09-26 DIAGNOSIS — R030 Elevated blood-pressure reading, without diagnosis of hypertension: Secondary | ICD-10-CM | POA: Insufficient documentation

## 2012-09-26 DIAGNOSIS — M24819 Other specific joint derangements of unspecified shoulder, not elsewhere classified: Secondary | ICD-10-CM | POA: Insufficient documentation

## 2012-09-26 DIAGNOSIS — M19019 Primary osteoarthritis, unspecified shoulder: Secondary | ICD-10-CM | POA: Insufficient documentation

## 2012-09-26 DIAGNOSIS — H409 Unspecified glaucoma: Secondary | ICD-10-CM | POA: Insufficient documentation

## 2012-09-26 DIAGNOSIS — Z8249 Family history of ischemic heart disease and other diseases of the circulatory system: Secondary | ICD-10-CM | POA: Insufficient documentation

## 2012-09-26 DIAGNOSIS — M25819 Other specified joint disorders, unspecified shoulder: Secondary | ICD-10-CM | POA: Insufficient documentation

## 2012-09-26 HISTORY — PX: SHOULDER ARTHROSCOPY WITH ROTATOR CUFF REPAIR AND SUBACROMIAL DECOMPRESSION: SHX5686

## 2012-09-26 SURGERY — SHOULDER ARTHROSCOPY WITH ROTATOR CUFF REPAIR AND SUBACROMIAL DECOMPRESSION
Anesthesia: General | Site: Shoulder | Laterality: Right | Wound class: Clean

## 2012-09-26 MED ORDER — GLYCOPYRROLATE 0.2 MG/ML IJ SOLN
INTRAMUSCULAR | Status: DC | PRN
  Administered 2012-09-26: 0.4 mg via INTRAVENOUS

## 2012-09-26 MED ORDER — PROPOFOL 10 MG/ML IV BOLUS
INTRAVENOUS | Status: DC | PRN
  Administered 2012-09-26: 180 mg via INTRAVENOUS

## 2012-09-26 MED ORDER — MIDAZOLAM HCL 2 MG/2ML IJ SOLN
2.0000 mg | INTRAMUSCULAR | Status: DC | PRN
  Administered 2012-09-26: 2 mg via INTRAVENOUS

## 2012-09-26 MED ORDER — NEOSTIGMINE METHYLSULFATE 1 MG/ML IJ SOLN
INTRAMUSCULAR | Status: DC | PRN
  Administered 2012-09-26: 3 mg via INTRAVENOUS

## 2012-09-26 MED ORDER — LACTATED RINGERS IV SOLN
INTRAVENOUS | Status: DC | PRN
  Administered 2012-09-26 (×2): via INTRAVENOUS

## 2012-09-26 MED ORDER — ACETAMINOPHEN 10 MG/ML IV SOLN
INTRAVENOUS | Status: AC
  Filled 2012-09-26: qty 100

## 2012-09-26 MED ORDER — ONDANSETRON HCL 4 MG/2ML IJ SOLN
INTRAMUSCULAR | Status: DC | PRN
  Administered 2012-09-26: 4 mg via INTRAVENOUS

## 2012-09-26 MED ORDER — FENTANYL CITRATE 0.05 MG/ML IJ SOLN
INTRAMUSCULAR | Status: AC
  Filled 2012-09-26: qty 2

## 2012-09-26 MED ORDER — MIDAZOLAM HCL 2 MG/2ML IJ SOLN
INTRAMUSCULAR | Status: AC
  Filled 2012-09-26: qty 2

## 2012-09-26 MED ORDER — FENTANYL CITRATE 0.05 MG/ML IJ SOLN
INTRAMUSCULAR | Status: DC | PRN
  Administered 2012-09-26: 100 ug via INTRAVENOUS

## 2012-09-26 MED ORDER — ONDANSETRON HCL 4 MG/2ML IJ SOLN: 4.0000 mg | Freq: Once | INTRAMUSCULAR | Status: DC | PRN

## 2012-09-26 MED ORDER — SODIUM CHLORIDE 0.9 % IR SOLN
Status: DC | PRN
  Administered 2012-09-26: 6000 mL

## 2012-09-26 MED ORDER — DEXTROSE 5 % IV SOLN
INTRAVENOUS | Status: DC | PRN
  Administered 2012-09-26: 10:00:00 via INTRAVENOUS

## 2012-09-26 MED ORDER — HYDROMORPHONE HCL PF 1 MG/ML IJ SOLN
INTRAMUSCULAR | Status: AC
  Filled 2012-09-26: qty 1

## 2012-09-26 MED ORDER — LACTATED RINGERS IV SOLN
INTRAVENOUS | Status: DC
  Administered 2012-09-26: 10:00:00 via INTRAVENOUS

## 2012-09-26 MED ORDER — HYDROMORPHONE HCL PF 1 MG/ML IJ SOLN
0.2500 mg | INTRAMUSCULAR | Status: DC | PRN
  Administered 2012-09-26 (×3): 0.5 mg via INTRAVENOUS

## 2012-09-26 MED ORDER — CHLORHEXIDINE GLUCONATE 4 % EX LIQD: 60.0000 mL | Freq: Once | CUTANEOUS | Status: DC

## 2012-09-26 MED ORDER — SODIUM CHLORIDE 0.9 % IV SOLN: INTRAVENOUS | Status: DC

## 2012-09-26 MED ORDER — ROCURONIUM BROMIDE 100 MG/10ML IV SOLN
INTRAVENOUS | Status: DC | PRN
  Administered 2012-09-26: 50 mg via INTRAVENOUS

## 2012-09-26 MED ORDER — ARTIFICIAL TEARS OP OINT
TOPICAL_OINTMENT | OPHTHALMIC | Status: DC | PRN
  Administered 2012-09-26: 1 via OPHTHALMIC

## 2012-09-26 MED ORDER — FENTANYL CITRATE 0.05 MG/ML IJ SOLN
100.0000 ug | Freq: Once | INTRAMUSCULAR | Status: AC
  Administered 2012-09-26: 100 ug via INTRAVENOUS

## 2012-09-26 MED ORDER — LIDOCAINE HCL (CARDIAC) 20 MG/ML IV SOLN
INTRAVENOUS | Status: DC | PRN
  Administered 2012-09-26: 40 mg via INTRAVENOUS

## 2012-09-26 MED ORDER — ACETAMINOPHEN 10 MG/ML IV SOLN
1000.0000 mg | Freq: Once | INTRAVENOUS | Status: AC | PRN
  Administered 2012-09-26: 1000 mg via INTRAVENOUS

## 2012-09-26 SURGICAL SUPPLY — 42 items
BIT DRILL TAK (DRILL) IMPLANT
BLADE CUDA 5.5 (BLADE) IMPLANT
BLADE GREAT WHITE 4.2 (BLADE) ×2 IMPLANT
BLADE SURG 11 STRL SS (BLADE) ×2 IMPLANT
BUR OVAL 6.0 (BURR) ×2 IMPLANT
CANNULA SHOULDER 7CM (CANNULA) ×2 IMPLANT
CLOTH BEACON ORANGE TIMEOUT ST (SAFETY) ×2 IMPLANT
DRAPE STERI 35X30 U-POUCH (DRAPES) ×2 IMPLANT
DRAPE SURG 17X23 STRL (DRAPES) ×2 IMPLANT
DRAPE U-SHAPE 47X51 STRL (DRAPES) ×2 IMPLANT
DRILL TAK (DRILL)
DRSG ADAPTIC 3X8 NADH LF (GAUZE/BANDAGES/DRESSINGS) ×2 IMPLANT
DRSG PAD ABDOMINAL 8X10 ST (GAUZE/BANDAGES/DRESSINGS) ×3 IMPLANT
DURAPREP 26ML APPLICATOR (WOUND CARE) ×2 IMPLANT
GLOVE BIOGEL PI IND STRL 8 (GLOVE) ×2 IMPLANT
GLOVE BIOGEL PI INDICATOR 8 (GLOVE) ×2
GLOVE ORTHO TXT STRL SZ7.5 (GLOVE) ×4 IMPLANT
GLOVE SURG ORTHO 8.0 STRL STRW (GLOVE) ×6 IMPLANT
GOWN PREVENTION PLUS XLARGE (GOWN DISPOSABLE) ×4 IMPLANT
GOWN STRL NON-REIN LRG LVL3 (GOWN DISPOSABLE) ×4 IMPLANT
KIT BASIN OR (CUSTOM PROCEDURE TRAY) ×2 IMPLANT
KIT ROOM TURNOVER OR (KITS) ×2 IMPLANT
MANIFOLD NEPTUNE II (INSTRUMENTS) ×2 IMPLANT
NDL SPNL 18GX3.5 QUINCKE PK (NEEDLE) ×1 IMPLANT
NEEDLE 22X1 1/2 (OR ONLY) (NEEDLE) ×2 IMPLANT
NEEDLE SPNL 18GX3.5 QUINCKE PK (NEEDLE) ×2 IMPLANT
NS IRRIG 1000ML POUR BTL (IV SOLUTION) ×2 IMPLANT
PACK SHOULDER (CUSTOM PROCEDURE TRAY) ×2 IMPLANT
PAD ARMBOARD 7.5X6 YLW CONV (MISCELLANEOUS) ×4 IMPLANT
SET ARTHROSCOPY TUBING (MISCELLANEOUS) ×2
SET ARTHROSCOPY TUBING LN (MISCELLANEOUS) ×1 IMPLANT
SPEAR FASTAKII (SLEEVE) IMPLANT
SPONGE GAUZE 4X4 12PLY (GAUZE/BANDAGES/DRESSINGS) ×3 IMPLANT
SPONGE LAP 4X18 X RAY DECT (DISPOSABLE) ×4 IMPLANT
SUT ETHILON 3 0 PS 1 (SUTURE) ×2 IMPLANT
SYR 20ML ECCENTRIC (SYRINGE) ×2 IMPLANT
SYR CONTROL 10ML LL (SYRINGE) ×2 IMPLANT
TAPE CLOTH SURG 6X10 WHT LF (GAUZE/BANDAGES/DRESSINGS) ×1 IMPLANT
TOWEL OR 17X24 6PK STRL BLUE (TOWEL DISPOSABLE) ×2 IMPLANT
TOWEL OR 17X26 10 PK STRL BLUE (TOWEL DISPOSABLE) ×2 IMPLANT
WAND 90 DEG TURBOVAC W/CORD (SURGICAL WAND) IMPLANT
WATER STERILE IRR 1000ML POUR (IV SOLUTION) ×2 IMPLANT

## 2012-09-26 NOTE — Preoperative (Signed)
Beta Blockers   Reason not to administer Beta Blockers:Not Applicable 

## 2012-09-26 NOTE — H&P (Signed)
Micheal Lawson is an 46 y.o. male.   Chief Complaint: Right shoulder pain HPI: 46yo male over 2 months of right shoulder pain, failed conservative treatments with po NSAID, corticosteroid injection.  MRI showing intrasubstance tear of 2mm with significant signs of impingement.  Presents to the hospital today for planned outpatient surgery.  Past Medical History  Diagnosis Date  . ALLERGIC RHINITIS 05/08/2007    Qualifier: Diagnosis of  By: Charlsie Quest RMA, [name]    . ELEVATED BLOOD PRESSURE WITHOUT DIAGNOSIS OF HYPERTENSION 08/31/2008    Qualifier: Diagnosis of  By: Jonny Ruiz MD, Len Blalock   . GLAUCOMA 04/01/2009    Qualifier: Diagnosis of  By: Everardo All MD, Cleophas Dunker     Past Surgical History  Procedure Date  . Electrocardiogram 04/04/2007  . Cyst removal neck     Family History  Problem Relation Age of Onset  . Heart disease Father     CABG 39  . Leukemia Sister    Social History:  reports that he has never smoked. He does not have any smokeless tobacco history on file. He reports that he drinks alcohol. His drug history not on file.  Allergies: No Known Allergies  Medications Prior to Admission  Medication Sig Dispense Refill  . aspirin EC 81 MG tablet Take 81 mg by mouth daily.      . cetirizine (ZYRTEC) 10 MG tablet Take 10 mg by mouth daily.      . dorzolamide-timolol (COSOPT) 22.3-6.8 MG/ML ophthalmic solution Place 1 drop into both eyes 2 (two) times daily.      Marland Kitchen ibuprofen (ADVIL,MOTRIN) 200 MG tablet Take 400-600 mg by mouth every 6 (six) hours as needed. As needed for pain.      . Travoprost, BAK Free, (TRAVATAN) 0.004 % SOLN ophthalmic solution Place 1 drop into both eyes daily.        No results found for this or any previous visit (from the past 48 hour(s)). No results found.  Review of Systems  Constitutional: Negative.   HENT: Negative.   Eyes: Negative.   Respiratory: Negative.   Cardiovascular: Negative.   Gastrointestinal: Negative.   Genitourinary: Negative.    Musculoskeletal: Positive for joint pain.  Skin: Negative.   Neurological: Negative for dizziness and tingling.  Endo/Heme/Allergies: Does not bruise/bleed easily.  Psychiatric/Behavioral: Negative for depression.    Blood pressure 145/78, pulse 56, temperature 97.9 F (36.6 C), temperature source Oral, resp. rate 18, SpO2 98.00%. Physical Exam  Constitutional: He is oriented to person, place, and time. He appears well-developed and well-nourished. No distress.  HENT:  Head: Normocephalic and atraumatic.  Nose: Nose normal.  Eyes: Conjunctivae normal and EOM are normal. Pupils are equal, round, and reactive to light.  Neck: Normal range of motion. Neck supple.  Cardiovascular: Normal rate, regular rhythm and normal heart sounds.   Respiratory: Effort normal and breath sounds normal. No respiratory distress. He has no wheezes.  GI: Soft. Bowel sounds are normal. He exhibits no distension. There is no tenderness.  Musculoskeletal:       Right shoulder: He exhibits decreased range of motion, tenderness and pain.  Lymphadenopathy:    He has no cervical adenopathy.  Neurological: He is alert and oriented to person, place, and time. He has normal reflexes. No cranial nerve deficit.  Skin: Skin is warm and dry. No rash noted. No erythema.  Psychiatric: He has a normal mood and affect. His behavior is normal.     Assessment/Plan Risks and benefits of right shoulder  arthroscopy were discussed and patient wishes to proceed.  Plan for right shoulder arthroscopy, debridement, acrimioplasty, distal clavicle excision, possible mini open rotator cuff repair.  Outpatient surgery, rx given, has f/u in a week.    Margart Sickles 09/26/2012, 7:18 AM

## 2012-09-26 NOTE — Transfer of Care (Signed)
Immediate Anesthesia Transfer of Care Note  Patient: Micheal Lawson  Procedure(s) Performed: Procedure(s) (LRB) with comments: SHOULDER ARTHROSCOPY WITH ROTATOR CUFF REPAIR AND SUBACROMIAL DECOMPRESSION (Right) - RIGHT SHOULDER ARTHROSCOPY WITH EXTENSIVE DEBRIDEMENT, SUBACROMIAL DECOMPRESSION, PARTIAL ACROMIOPLASTY WITH CORACROMIAL RELEASE  Patient Location: PACU  Anesthesia Type:GA combined with regional for post-op pain  Level of Consciousness: awake, alert  and oriented  Airway & Oxygen Therapy: Patient Spontanous Breathing and Patient connected to nasal cannula oxygen  Post-op Assessment: Report given to PACU RN and Post -op Vital signs reviewed and stable  Post vital signs: Reviewed and stable  Complications: No apparent anesthesia complications

## 2012-09-26 NOTE — Anesthesia Preprocedure Evaluation (Addendum)
Anesthesia Evaluation  Patient identified by MRN, date of birth, ID band Patient awake    Reviewed: Allergy & Precautions, NPO status , Patient's Chart, lab work & pertinent test results  Airway Mallampati: I      Dental  (+) Teeth Intact and Dental Advisory Given   Pulmonary neg pulmonary ROS,  breath sounds clear to auscultation        Cardiovascular hypertension, Rhythm:Regular     Neuro/Psych negative neurological ROS  negative psych ROS   GI/Hepatic negative GI ROS, Neg liver ROS,   Endo/Other  negative endocrine ROS  Renal/GU negative Renal ROS     Musculoskeletal negative musculoskeletal ROS (+)   Abdominal   Peds  Hematology negative hematology ROS (+)   Anesthesia Other Findings   Reproductive/Obstetrics                         Anesthesia Physical Anesthesia Plan  ASA: II  Anesthesia Plan: General   Post-op Pain Management:    Induction: Intravenous  Airway Management Planned: Oral ETT  Additional Equipment:   Intra-op Plan:   Post-operative Plan: Extubation in OR  Informed Consent: I have reviewed the patients History and Physical, chart, labs and discussed the procedure including the risks, benefits and alternatives for the proposed anesthesia with the patient or authorized representative who has indicated his/her understanding and acceptance.   Dental advisory given  Plan Discussed with: Surgeon, Anesthesiologist and CRNA  Anesthesia Plan Comments: (Impingement R. Shoulder Allergic rhinitis Glaucoma  Plan GA with ISB  Kipp Brood, MD)       Anesthesia Quick Evaluation

## 2012-09-26 NOTE — Anesthesia Procedure Notes (Addendum)
Procedure Name: Intubation Date/Time: 09/26/2012 10:34 AM Performed by: Julianne Rice K Pre-anesthesia Checklist: Patient identified, Timeout performed, Emergency Drugs available, Suction available and Patient being monitored Patient Re-evaluated:Patient Re-evaluated prior to inductionOxygen Delivery Method: Circle system utilized Preoxygenation: Pre-oxygenation with 100% oxygen Intubation Type: IV induction Ventilation: Mask ventilation without difficulty Laryngoscope Size: Mac and 4 Grade View: Grade III Tube type: Oral Tube size: 8.5 mm Number of attempts: 1 Airway Equipment and Method: LTA kit utilized and Stylet Placement Confirmation: ETT inserted through vocal cords under direct vision,  positive ETCO2 and breath sounds checked- equal and bilateral Secured at: 24 cm Tube secured with: Tape Dental Injury: Teeth and Oropharynx as per pre-operative assessment    Anesthesia Regional Block:  Interscalene brachial plexus block  Pre-Anesthetic Checklist: ,, timeout performed, Correct Patient, Correct Site, Correct Laterality, Correct Procedure, Correct Position, site marked, Risks and benefits discussed,  Surgical consent,  Pre-op evaluation,  At surgeon's request and post-op pain management  Laterality: Right  Prep: chloraprep       Needles:  Injection technique: Single-shot  Needle Type: Echogenic Stimulator Needle     Needle Length:cm 9 cm     Additional Needles:  Procedures: ultrasound guided (picture in chart) and nerve stimulator Interscalene brachial plexus block Narrative:  Start time: 09/26/2012 10:20 AM End time: 09/26/2012 10:25 AM Injection made incrementally with aspirations every 5 mL.  Performed by: Personally   Additional Notes: 30 cc 0.5% marcaine with 1:200 Epi injected easily  Kipp Brood, MD

## 2012-09-26 NOTE — Anesthesia Postprocedure Evaluation (Signed)
  Anesthesia Post-op Note  Patient: Micheal Lawson  Procedure(s) Performed: Procedure(s) (LRB) with comments: SHOULDER ARTHROSCOPY WITH ROTATOR CUFF REPAIR AND SUBACROMIAL DECOMPRESSION (Right) - RIGHT SHOULDER ARTHROSCOPY WITH EXTENSIVE DEBRIDEMENT, SUBACROMIAL DECOMPRESSION, PARTIAL ACROMIOPLASTY WITH CORACROMIAL RELEASE  Patient Location: PACU  Anesthesia Type:General  Level of Consciousness: awake, alert  and oriented  Airway and Oxygen Therapy: Patient Spontanous Breathing and Patient connected to nasal cannula oxygen  Post-op Pain: mild  Post-op Assessment: Post-op Vital signs reviewed, Patient's Cardiovascular Status Stable, Respiratory Function Stable, No signs of Nausea or vomiting and Pain level controlled  Post-op Vital Signs: stable  Complications: No apparent anesthesia complications

## 2012-09-26 NOTE — Brief Op Note (Signed)
09/26/2012  11:41 AM  PATIENT:  Micheal Lawson  46 y.o. male  PRE-OPERATIVE DIAGNOSIS:  RIGHT SHOULDER: DISORDER ARTICULAR CARTILAGE, DISORDERS OF BURSAE AND TENDONS IN SHOULDER REGION, UNSPECIFIED, ROTATOR CUFF SYNDROME OF SHOULDER AND ALLIED DISORDERS  POST-OPERATIVE DIAGNOSIS:  RIGHT SHOULDER: DISORDER ARTICULAR CARTILAGE, DISORDERS OF BURSAE AND TENDONS IN SHOULDER REGION, UNSPECIFIED, ROTATOR CUFF SYNDROME OF SHOULDER AND ALLIED DISORDERS  PROCEDURE:  Procedure(s) (LRB) with comments: SHOULDER ARTHROSCOPY WITH ROTATOR CUFF REPAIR AND SUBACROMIAL DECOMPRESSION (Right) - RIGHT SHOULDER ARTHROSCOPY WITH EXTENSIVE DEBRIDEMENT, SUBACROMIAL DECOMPRESSION, PARTIAL ACROMIOPLASTY WITH CORACROMIAL RELEASE  SURGEON:  Surgeon(s) and Role:    * W D Carloyn Manner., MD - Primary  PHYSICIAN ASSISTANT:   ASSISTANTS: Margart Sickles, PA-C   ANESTHESIA:   regional and general  EBL:  Total I/O In: 1050 [I.V.:1050] Out: -   BLOOD ADMINISTERED:none  DRAINS: none   LOCAL MEDICATIONS USED:  NONE  SPECIMEN:  No Specimen  DISPOSITION OF SPECIMEN:  N/A  COUNTS:  YES  TOURNIQUET:  * No tourniquets in log *  DICTATION: .Other Dictation: Dictation Number   PLAN OF CARE: Discharge to home after PACU  PATIENT DISPOSITION:  PACU - hemodynamically stable.   Delay start of Pharmacological VTE agent (>24hrs) due to surgical blood loss or risk of bleeding: not applicable

## 2012-09-27 NOTE — Op Note (Signed)
NAME:  Micheal Lawson, Micheal Lawson NO.:  [id number]  MEDICAL RECORD NO.:  [id number]  LOCATION:  [facility]                         FACILITY:  [facility]  PHYSICIAN:  Dyke Brackett, M.D.    DATE OF BIRTH:  1966-06-07  DATE OF PROCEDURE: DATE OF DISCHARGE:  09/26/2012                              OPERATIVE REPORT   INDICATIONS:  A 46 year old male with recurrent shoulder pain, impingement type phenomenon, cuff pathology, thought to be amenable to outpatient surgery.  PREOPERATIVE DIAGNOSES: 1. Degenerative tear of anterior superior labrum. 2. Partial subscapularis muscle tear. 3. Impingement. 4. Acromioclavicular joint arthritis.  POSTOPERATIVE DIAGNOSES: 1. Degenerative tear of anterior superior labrum. 2. Partial subscapularis muscle tear. 3. Impingement. 4. Acromioclavicular joint arthritis.  OPERATION: 1. Arthroscopic debridement of torn labrum, subscapularis. 2. Arthroscopic acromioplasty. 3. Arthroscopic excision distal clavicle all for the right shoulder.  SURGEON:  Dyke Brackett, M.D.  ASSISTANT:  Margart Sickles, PA-C  DESCRIPTION OF PROCEDURE:  Examination under anesthesia showed no instability, no loss of motion.  He was arthroscoped through the posterolateral and anterior portal.  Systematic inspection of the shoulder intra-articularly showed a degenerative tearing of the anterior superior labrum, which was debrided.  There was some fissuring on the cup, which was debrided and noting approaching full thickness tear and glenohumeral articular surface normal.  The superior band of the subscap was intact, biceps anchor was intact as well as the exit of the biceps from the shoulder.  We did have a mid-substance partial interstitial tear of the subscap with the superior band above and below were intact, we debrided the partial subscap tear.  Subacromial space was hypertrophied, inflamed with moderate impingement from the end of the acromion.  We performed an  acromioplasty.  Again, hypertrophy and arthritis of the distal clavicle was noted, sizing 1-1.5 cm of distal clavicle.  Bursectomy was carried out.  Superior surface of the cuff showed abrasion-type phenomenon.  Shoulder drained free of fluid. Portals were closed with nylon.  Placed in a lightly compressive sterile dressing, taken to the recovery room in stable condition.     Dyke Brackett, M.D.     WDC/MEDQ  D:  09/26/2012  T:  09/27/2012  Job:  [id number]

## 2012-09-29 ENCOUNTER — Encounter (HOSPITAL_COMMUNITY): Payer: Self-pay | Admitting: Orthopedic Surgery

## 2012-11-22 ENCOUNTER — Other Ambulatory Visit: Payer: Self-pay

## 2013-07-24 ENCOUNTER — Ambulatory Visit (INDEPENDENT_AMBULATORY_CARE_PROVIDER_SITE_OTHER): Payer: 59 | Admitting: Endocrinology

## 2013-07-24 ENCOUNTER — Encounter: Payer: Self-pay | Admitting: Endocrinology

## 2013-07-24 VITALS — BP 130/80 | HR 56 | Wt 198.0 lb

## 2013-07-24 DIAGNOSIS — M25552 Pain in left hip: Secondary | ICD-10-CM

## 2013-07-24 DIAGNOSIS — M25559 Pain in unspecified hip: Secondary | ICD-10-CM

## 2013-07-24 DIAGNOSIS — Z0000 Encounter for general adult medical examination without abnormal findings: Secondary | ICD-10-CM

## 2013-07-24 LAB — LIPID PANEL
Cholesterol: 159 mg/dL (ref 0–200)
Total CHOL/HDL Ratio: 4
Triglycerides: 166 mg/dL — ABNORMAL HIGH (ref 0.0–149.0)

## 2013-07-24 LAB — CBC WITH DIFFERENTIAL/PLATELET
Basophils Absolute: 0.1 10*3/uL (ref 0.0–0.1)
Eosinophils Absolute: 0.2 10*3/uL (ref 0.0–0.7)
HCT: 44.8 % (ref 39.0–52.0)
Lymphs Abs: 2.6 10*3/uL (ref 0.7–4.0)
MCHC: 34.7 g/dL (ref 30.0–36.0)
MCV: 89.1 fl (ref 78.0–100.0)
Monocytes Absolute: 0.8 10*3/uL (ref 0.1–1.0)
Platelets: 297 10*3/uL (ref 150.0–400.0)
RDW: 12.4 % (ref 11.5–14.6)

## 2013-07-24 LAB — URINALYSIS, ROUTINE W REFLEX MICROSCOPIC
Bilirubin Urine: NEGATIVE
Hgb urine dipstick: NEGATIVE
Ketones, ur: NEGATIVE
Leukocytes, UA: NEGATIVE

## 2013-07-24 LAB — HEPATIC FUNCTION PANEL
ALT: 24 U/L (ref 0–53)
Albumin: 4.3 g/dL (ref 3.5–5.2)
Alkaline Phosphatase: 65 U/L (ref 39–117)
Bilirubin, Direct: 0 mg/dL (ref 0.0–0.3)
Total Protein: 7.5 g/dL (ref 6.0–8.3)

## 2013-07-24 LAB — BASIC METABOLIC PANEL
CO2: 27 mEq/L (ref 19–32)
Chloride: 104 mEq/L (ref 96–112)
Creatinine, Ser: 1 mg/dL (ref 0.4–1.5)
Glucose, Bld: 95 mg/dL (ref 70–99)

## 2013-07-24 LAB — TSH: TSH: 1.69 u[IU]/mL (ref 0.35–5.50)

## 2013-07-24 NOTE — Progress Notes (Signed)
  Subjective:    Patient ID: Micheal Lawson, male    DOB: 1966-09-10, 47 y.o.   MRN: [id number]  HPI Pt states few mos of moderate pain at the left posterior thigh, but no assoc numbness.  He feels this was precip by running.  It is worsened by inactivity.  Past Medical History  Diagnosis Date  . ALLERGIC RHINITIS 05/08/2007    Qualifier: Diagnosis of  By: Charlsie Quest RMA, [name]    . ELEVATED BLOOD PRESSURE WITHOUT DIAGNOSIS OF HYPERTENSION 08/31/2008    Qualifier: Diagnosis of  By: Jonny Ruiz MD, Len Blalock   . GLAUCOMA 04/01/2009    Qualifier: Diagnosis of  By: Everardo All MD, Cleophas Dunker     Past Surgical History  Procedure Laterality Date  . Electrocardiogram  04/04/2007  . Cyst removal neck    . Shoulder arthroscopy with rotator cuff repair and subacromial decompression  09/26/2012    Procedure: SHOULDER ARTHROSCOPY WITH ROTATOR CUFF REPAIR AND SUBACROMIAL DECOMPRESSION;  Surgeon: Thera Flake., MD;  Location: [facility];  Service: Orthopedics;  Laterality: Right;  RIGHT SHOULDER ARTHROSCOPY WITH EXTENSIVE DEBRIDEMENT, SUBACROMIAL DECOMPRESSION, PARTIAL ACROMIOPLASTY WITH CORACROMIAL RELEASE    History   Social History  . Marital Status: Married    Spouse Name: N/A    Number of Children: N/A  . Years of Education: N/A   Occupational History  . IT Research scientist (physical sciences)) for [employer]   Social History Main Topics  . Smoking status: Never Smoker   . Smokeless tobacco: Not on file  . Alcohol Use: Yes     Comment: occ  . Drug Use: Not on file  . Sexual Activity: Not on file   Other Topics Concern  . Not on file   Social History Narrative  . No narrative on file    Current Outpatient Prescriptions on File Prior to Visit  Medication Sig Dispense Refill  . aspirin EC 81 MG tablet Take 81 mg by mouth daily.      . cetirizine (ZYRTEC) 10 MG tablet Take 10 mg by mouth daily.      . dorzolamide-timolol (COSOPT) 22.3-6.8 MG/ML ophthalmic solution Place 1 drop into both eyes 2 (two) times daily.      Marland Kitchen  ibuprofen (ADVIL,MOTRIN) 200 MG tablet Take 400-600 mg by mouth every 6 (six) hours as needed. As needed for pain.      . Travoprost, BAK Free, (TRAVATAN) 0.004 % SOLN ophthalmic solution Place 1 drop into both eyes daily.       No current facility-administered medications on file prior to visit.   No Known Allergies  Family History  Problem Relation Age of Onset  . Heart disease Father     CABG 34  . Leukemia Sister    BP 130/80  Pulse 56  Wt 198 lb (89.812 kg)  BMI 26.13 kg/m2  SpO2 97%  Review of Systems He has a nodule at the scalp.  No rash on the thigh or leg.    Objective:   Physical Exam VITAL SIGNS:  See vs page GENERAL: no distress Scalp: approx 4 mm pink nodule, at the left parietal area Left thigh: nontender Gait: normal.     Assessment & Plan:  Thigh pain, new, uncertain etiology Scalp nodule: new

## 2013-07-24 NOTE — Patient Instructions (Addendum)
Please call the old office at [phone], to ask for an appointment with dr Posey Rea, to have the nodule on your head removed.  Also, please see a sports medicine specialist.  you will receive a phone call, about a day and time for an appointment. blood tests are being requested for you today.  We'll contact you with results.

## 2013-07-29 ENCOUNTER — Encounter: Payer: Self-pay | Admitting: Family Medicine

## 2013-07-29 ENCOUNTER — Ambulatory Visit (INDEPENDENT_AMBULATORY_CARE_PROVIDER_SITE_OTHER): Payer: 59 | Admitting: Family Medicine

## 2013-07-29 VITALS — BP 128/82 | HR 56 | Ht 73.0 in | Wt 199.0 lb

## 2013-07-29 DIAGNOSIS — IMO0002 Reserved for concepts with insufficient information to code with codable children: Secondary | ICD-10-CM

## 2013-07-29 DIAGNOSIS — S76312A Strain of muscle, fascia and tendon of the posterior muscle group at thigh level, left thigh, initial encounter: Secondary | ICD-10-CM

## 2013-07-29 MED ORDER — TRAMADOL HCL 50 MG PO TABS: 50.0000 mg | ORAL_TABLET | Freq: Every evening | ORAL | Status: DC | PRN

## 2013-07-29 MED ORDER — MELOXICAM 15 MG PO TABS: 15.0000 mg | ORAL_TABLET | Freq: Every day | ORAL | Status: DC

## 2013-07-29 NOTE — Assessment & Plan Note (Signed)
Patient has what appears to be a very mild hamstring strain. Patient seems to be improving fairly slowly on his own. Did not do an ultrasound because I was unable to find any weakness or palpable defect. If he continues at followup I would like to do an ultrasound to rule out ischial bursitis and chronic tendinopathy. Patient in the interim will try to get a compression sleeve over-the-counter, icing protocol, meloxicam daily for 10 days then as needed. Patient given home exercise program and he'll do on a daily basis. We did discuss he centric exercises which will be the most beneficial for him. Patient will followup again in 3-4 weeks to make sure he continues to improve.

## 2013-07-29 NOTE — Progress Notes (Signed)
  I'm seeing this patient by the request  of:  Romero Belling, MD  CC: left hamstring pain  HPI: patient is a pleasant 47 year old gentleman who is coming in with a 3 week history of left hamstring pain. Patient states he was running with his son and noticed some discomfort only one and a half miles. Patient states since that time he has had this dull, aching sensation that can be sharp with certain movements. Patient states that over time it seems to be improving but very slowly. Patient is concerned because basketball season is around a corner and wants to be plain. Patient has taken ibuprofen which has been beneficial. Denies any radiation a leg, denies any numbness or tingling. Patient states that he can have some discomfort in the buttocks region as well. Denies any knee pain. Patient that the severity of 5/10. Patient is able to do regular activities of daily living but can notice after a significant amount walking he can't discomfort   Past medical, surgical, family and social history reviewed. Medications reviewed all in the electronic medical record.   Review of Systems: No headache, visual changes, nausea, vomiting, diarrhea, constipation, dizziness, abdominal pain, skin rash, fevers, chills, night sweats, weight loss, swollen lymph nodes, body aches, joint swelling, muscle aches, chest pain, shortness of breath, mood changes.   Objective:    Blood pressure 128/82, pulse 56, height 6\' 1"  (1.854 m), weight 199 lb (90.266 kg), SpO2 97.00%.   General: No apparent distress alert and oriented x3 mood and affect normal, dressed appropriately.  HEENT: Pupils equal, extraocular movements intact Respiratory: Patient's speak in full sentences and does not appear short of breath Cardiovascular: No lower extremity edema, non tender, no erythema Skin: Warm dry intact with no signs of infection or rash on extremities or on axial skeleton. Abdomen: Soft nontender Neuro: Cranial nerves II through XII  are intact, neurovascularly intact in all extremities with 2+ DTRs and 2+ pulses. Lymph: No lymphadenopathy of posterior or anterior cervical chain or axillae bilaterally.  Gait normal with good balance and coordination.  MSK: Non tender with full range of motion and good stability and symmetric strength and tone of shoulders, elbows, wrist, , and ankles bilaterally.  Hip: Bilaterally ROM IR: 45 Deg, ER: 45 Deg, Flexion: 120 Deg, Extension: 100 Deg, Abduction: 45 Deg, Adduction: 45 Deg Strength IR: 5/5, ER: 5/5, Flexion: 5/5, Extension: 5/5, Abduction: 5/5, Adduction: 5/5 Pelvic alignment unremarkable to inspection and palpation. Standing hip rotation and gait without trendelenburg sign / unsteadiness. Greater trochanter without tenderness to palpation. No tenderness over piriformis and greater trochanter. No pain with FABER or FADIR. No SI joint tenderness and normal minimal SI movement. Knee: Left Normal to inspection with no erythema or effusion or obvious bony abnormalities. Palpation normal with no warmth, joint line tenderness, patellar tenderness, or condyle tenderness. ROM full in flexion and extension and lower leg rotation. Ligaments with solid consistent endpoints including ACL, PCL, LCL, MCL. Negative Mcmurray's, Apley's, and Thessalonian tests. Non painful patellar compression. Patellar glide without crepitus. Patellar and quadriceps tendons unremarkable. Hamstring is slightly tight on the left compared to the contralateral side. Patient though does have good strength 5 out of 5 of the hamstring. This seems to be symmetric to his contralateral side. On palpation there is no defect appreciated. He is neurovascularly intact distally.   Impression and Recommendations:     This case required medical decision making of moderate complexity.

## 2013-07-29 NOTE — Patient Instructions (Signed)
Good to meet you We will try meloxicam daily 10 days then as needed.  Ice 20 minutes  2 times a day Compression would be helpful with activity and 30 minutes afterward.  Exercises daily Look up askling exercises.  (Diver, extender,  And er Come back in 3 weeks, if not better will ultrasound and consider other options.

## 2013-08-13 ENCOUNTER — Other Ambulatory Visit: Payer: Self-pay

## 2013-08-21 ENCOUNTER — Ambulatory Visit (INDEPENDENT_AMBULATORY_CARE_PROVIDER_SITE_OTHER): Payer: 59 | Admitting: Family Medicine

## 2013-08-21 ENCOUNTER — Encounter: Payer: Self-pay | Admitting: Family Medicine

## 2013-08-21 VITALS — BP 152/90 | HR 64

## 2013-08-21 DIAGNOSIS — Z5189 Encounter for other specified aftercare: Secondary | ICD-10-CM

## 2013-08-21 DIAGNOSIS — S76312D Strain of muscle, fascia and tendon of the posterior muscle group at thigh level, left thigh, subsequent encounter: Secondary | ICD-10-CM

## 2013-08-21 NOTE — Progress Notes (Signed)
  CC: left hamstring pain  HPI: patient is a pleasant 47 year old gentleman who is following up for hamstring pain. Patient was seen 3 weeks ago now 6 weeks after injury states he is approximately 90% better. Patient has been doing the exercises on a regular basis, wearing compression with activity, icing protocol. Patient has been able to play basketball which has been his main goal without any significant discomfort. Patient denies any pain with regular daily activities, no nighttime awakening, vision is no longer taking any meloxicam. Patient is very happy with the results so far. Patient denies any new symptoms.  Past medical, surgical, family and social history reviewed. Medications reviewed all in the electronic medical record.   Review of Systems: No headache, visual changes, nausea, vomiting, diarrhea, constipation, dizziness, abdominal pain, skin rash, fevers, chills, night sweats, weight loss, swollen lymph nodes, body aches, joint swelling, muscle aches, chest pain, shortness of breath, mood changes.   Objective:    Blood pressure 152/90, pulse 64, SpO2 98.00%.   General: No apparent distress alert and oriented x3 mood and affect normal, dressed appropriately.  HEENT: Pupils equal, extraocular movements intact Respiratory: Patient's speak in full sentences and does not appear short of breath Cardiovascular: No lower extremity edema, non tender, no erythema Skin: Warm dry intact with no signs of infection or rash on extremities or on axial skeleton. Abdomen: Soft nontender Neuro: Cranial nerves II through XII are intact, neurovascularly intact in all extremities with 2+ DTRs and 2+ pulses. Lymph: No lymphadenopathy of posterior or anterior cervical chain or axillae bilaterally.  Gait normal with good balance and coordination.  MSK: Non tender with full range of motion and good stability and symmetric strength and tone of shoulders, elbows, wrist, , and ankles bilaterally.  Hip:  Bilaterally ROM IR: 45 Deg, ER: 45 Deg, Flexion: 120 Deg, Extension: 100 Deg, Abduction: 45 Deg, Adduction: 45 Deg Strength IR: 5/5, ER: 5/5, Flexion: 5/5, Extension: 5/5, Abduction: 5/5, Adduction: 5/5 Pelvic alignment unremarkable to inspection and palpation. Standing hip rotation and gait without trendelenburg sign / unsteadiness. Greater trochanter without tenderness to palpation. No tenderness over piriformis and greater trochanter. No pain with FABER or FADIR. No SI joint tenderness and normal minimal SI movement. Knee: Left Normal to inspection with no erythema or effusion or obvious bony abnormalities. Palpation normal with no warmth, joint line tenderness, patellar tenderness, or condyle tenderness. ROM full in flexion and extension and lower leg rotation. Ligaments with solid consistent endpoints including ACL, PCL, LCL, MCL. Negative Mcmurray's, Apley's, and Thessalonian tests. Non painful patellar compression. Patellar glide without crepitus. Patellar and quadriceps tendons unremarkable. Hamstring is  Still mildly slightly tight on the left compared to the contralateral side. Patient though does have good strength 5 out of 5 of the hamstring. Symmetric compared to contralateral side  He is neurovascularly intact distally.   Impression and Recommendations:     This case required medical decision making of moderate complexity.

## 2013-08-21 NOTE — Patient Instructions (Signed)
You are doing great.  Compression still with acitivty and 30 minutes afterward.  Ice after activity Askling exercises  Meloxicam as needed. Come back as needed

## 2013-08-21 NOTE — Progress Notes (Signed)
Pre-visit discussion using our clinic review tool. No additional management support is needed unless otherwise documented below in the visit note.  

## 2013-08-21 NOTE — Assessment & Plan Note (Signed)
Patient is improving and doing very well. Patient was given strengthening exercises today. As long as patient continues to wear compression and 80 icing protocol with activity I do think he is going to continue to improve and become completely asymptomatic in the near future. Patient to follow up with me on an as-needed basis.

## 2013-09-08 ENCOUNTER — Ambulatory Visit (INDEPENDENT_AMBULATORY_CARE_PROVIDER_SITE_OTHER): Payer: 59 | Admitting: Endocrinology

## 2013-09-08 ENCOUNTER — Encounter: Payer: Self-pay | Admitting: Endocrinology

## 2013-09-08 VITALS — BP 118/80 | HR 66 | Ht 73.0 in | Wt 197.5 lb

## 2013-09-08 DIAGNOSIS — J069 Acute upper respiratory infection, unspecified: Secondary | ICD-10-CM

## 2013-09-08 MED ORDER — AZITHROMYCIN 500 MG PO TABS: 500.0000 mg | ORAL_TABLET | Freq: Every day | ORAL | Status: DC

## 2013-09-08 MED ORDER — PROMETHAZINE-CODEINE 6.25-10 MG/5ML PO SYRP: 5.0000 mL | ORAL_SOLUTION | ORAL | Status: DC | PRN

## 2013-09-08 NOTE — Progress Notes (Signed)
Subjective:    Patient ID: Micheal Lawson, male    DOB: 1966/04/24, 47 y.o.   MRN: [id number]  HPI Pt states few days of moderate congestion in the nose, and assoc prod cough.  Son has similar illness.  No h/o smoking or allergy probs.  Past Medical History  Diagnosis Date  . ALLERGIC RHINITIS 05/08/2007    Qualifier: Diagnosis of  By: Charlsie Quest RMA, [name]    . ELEVATED BLOOD PRESSURE WITHOUT DIAGNOSIS OF HYPERTENSION 08/31/2008    Qualifier: Diagnosis of  By: Jonny Ruiz MD, Len Blalock   . GLAUCOMA 04/01/2009    Qualifier: Diagnosis of  By: Everardo All MD, Cleophas Dunker     Past Surgical History  Procedure Laterality Date  . Electrocardiogram  04/04/2007  . Cyst removal neck    . Shoulder arthroscopy with rotator cuff repair and subacromial decompression  09/26/2012    Procedure: SHOULDER ARTHROSCOPY WITH ROTATOR CUFF REPAIR AND SUBACROMIAL DECOMPRESSION;  Surgeon: Thera Flake., MD;  Location: [facility];  Service: Orthopedics;  Laterality: Right;  RIGHT SHOULDER ARTHROSCOPY WITH EXTENSIVE DEBRIDEMENT, SUBACROMIAL DECOMPRESSION, PARTIAL ACROMIOPLASTY WITH CORACROMIAL RELEASE    History   Social History  . Marital Status: Married    Spouse Name: N/A    Number of Children: N/A  . Years of Education: N/A   Occupational History  . IT Research scientist (physical sciences)) for [employer]   Social History Main Topics  . Smoking status: Never Smoker   . Smokeless tobacco: Not on file  . Alcohol Use: Yes     Comment: occ  . Drug Use: Not on file  . Sexual Activity: Not on file   Other Topics Concern  . Not on file   Social History Narrative  . No narrative on file    Current Outpatient Prescriptions on File Prior to Visit  Medication Sig Dispense Refill  . aspirin EC 81 MG tablet Take 81 mg by mouth daily.      . cetirizine (ZYRTEC) 10 MG tablet Take 10 mg by mouth daily.      . dorzolamide-timolol (COSOPT) 22.3-6.8 MG/ML ophthalmic solution Place 1 drop into both eyes 2 (two) times daily.      Marland Kitchen ibuprofen  (ADVIL,MOTRIN) 200 MG tablet Take 400-600 mg by mouth every 6 (six) hours as needed. As needed for pain.      . meloxicam (MOBIC) 15 MG tablet Take 1 tablet (15 mg total) by mouth daily.  30 tablet  0  . traMADol (ULTRAM) 50 MG tablet Take 1 tablet (50 mg total) by mouth at bedtime as needed for pain.  30 tablet  2  . Travoprost, BAK Free, (TRAVATAN) 0.004 % SOLN ophthalmic solution Place 1 drop into both eyes daily.       No current facility-administered medications on file prior to visit.    No Known Allergies  Family History  Problem Relation Age of Onset  . Heart disease Father     CABG 74  . Leukemia Sister     BP 118/80  Pulse 66  Ht 6\' 1"  (1.854 m)  Wt 197 lb 8 oz (89.585 kg)  BMI 26.06 kg/m2  SpO2 98%  Review of Systems Denies fever and sore throat.  No earache.      Objective:   Physical Exam VITAL SIGNS:  See vs page GENERAL: no distress head: no deformity eyes: no periorbital swelling, no proptosis external nose and ears are normal mouth: no lesion seen Pharynx and tm's are slightly red.  Assessment & Plan:  URI, new

## 2013-09-08 NOTE — Patient Instructions (Signed)
i have sent a prescription to your pharmacy, for an antibiotic pill.   Here is a prescription for cough syrup.   Loratadine-d (non-prescription) will help your congestion.

## 2014-01-04 ENCOUNTER — Ambulatory Visit: Payer: 59 | Admitting: Endocrinology

## 2014-06-07 ENCOUNTER — Ambulatory Visit (INDEPENDENT_AMBULATORY_CARE_PROVIDER_SITE_OTHER): Payer: 59 | Admitting: Endocrinology

## 2014-06-07 ENCOUNTER — Encounter: Payer: Self-pay | Admitting: Endocrinology

## 2014-06-07 VITALS — BP 118/70 | HR 60 | Temp 97.9°F | Ht 73.0 in | Wt 198.0 lb

## 2014-06-07 DIAGNOSIS — K219 Gastro-esophageal reflux disease without esophagitis: Secondary | ICD-10-CM | POA: Diagnosis not present

## 2014-06-07 DIAGNOSIS — L821 Other seborrheic keratosis: Secondary | ICD-10-CM

## 2014-06-07 DIAGNOSIS — Z125 Encounter for screening for malignant neoplasm of prostate: Secondary | ICD-10-CM | POA: Diagnosis not present

## 2014-06-07 DIAGNOSIS — Z0000 Encounter for general adult medical examination without abnormal findings: Secondary | ICD-10-CM | POA: Diagnosis not present

## 2014-06-07 MED ORDER — OMEPRAZOLE 40 MG PO CPDR: 40.0000 mg | DELAYED_RELEASE_CAPSULE | Freq: Every day | ORAL | Status: DC

## 2014-06-07 NOTE — Progress Notes (Signed)
Subjective:    Patient ID: Micheal Lawson, male    DOB: [birth year], 48 y.o.   MRN: [id number]  HPI Pt is here for regular wellness examination, and is feeling pretty well in general, and says chronic med probs are stable, except as noted below Past Medical History  Diagnosis Date  . ALLERGIC RHINITIS 05/08/2007    Qualifier: Diagnosis of  By: Micheal Lawson, Micheal Lawson    . ELEVATED BLOOD PRESSURE WITHOUT DIAGNOSIS OF HYPERTENSION 08/31/2008    Qualifier: Diagnosis of  By: Micheal Reichmann MD, Micheal Lawson   . GLAUCOMA 04/01/2009    Qualifier: Diagnosis of  By: Micheal Drilling MD, Micheal [facility]     Past Surgical History  Procedure Laterality Date  . Electrocardiogram  04/04/2007  . Cyst removal neck    . Shoulder arthroscopy with rotator cuff repair and subacromial decompression  09/26/2012    Procedure: SHOULDER ARTHROSCOPY WITH ROTATOR CUFF REPAIR AND SUBACROMIAL DECOMPRESSION;  Surgeon: Micheal Rack., MD;  Location: [facility];  Service: Orthopedics;  Laterality: Right;  RIGHT SHOULDER ARTHROSCOPY WITH EXTENSIVE DEBRIDEMENT, SUBACROMIAL DECOMPRESSION, PARTIAL ACROMIOPLASTY WITH CORACROMIAL RELEASE    History   Social History  . Marital Status: Married    Spouse Name: N/A    Number of Children: N/A  . Years of Education: N/A   Occupational History  . 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 Smoking status: Never Smoker   . Smokeless tobacco: Not on file  . Alcohol Use: Yes     Comment: occ  . Drug Use: Not on file  . Sexual Activity: Not on file   Other Topics Concern  . Not on file   Social History Narrative  . No narrative on file    Current Outpatient Prescriptions on File Prior to Visit  Medication Sig Dispense Refill  . aspirin EC 81 MG tablet Take 81 mg by mouth daily.      . cetirizine (ZYRTEC) 10 MG tablet Take 10 mg by mouth daily.      . dorzolamide-timolol (COSOPT) 22.3-6.8 MG/ML ophthalmic solution Place 1 drop into both eyes 2 (two) times daily.      Micheal Lawson Kitchen ibuprofen  (ADVIL,MOTRIN) 200 MG tablet Take 400-600 mg by mouth every 6 (six) hours as needed. As needed for pain.      . Travoprost, BAK Free, (TRAVATAN) 0.004 % SOLN ophthalmic solution Place 1 drop into both eyes daily.       No current facility-administered medications on file prior to visit.    No Known Allergies  Family History  Problem Relation Age of Onset  . Heart disease Father     CABG 94  . Leukemia Sister     BP 118/70  Pulse 60  Temp(Src) 97.9 F (36.6 C) (Oral)  Ht 6\' 1"  (1.854 m)  Wt 198 lb (89.812 kg)  BMI 26.13 kg/m2  SpO2 98%     Review of Systems  Constitutional: Negative for fever.  HENT: Negative for hearing loss.   Eyes: Negative for visual disturbance.  Respiratory: Negative for shortness of breath.   Cardiovascular: Negative for chest pain.  Gastrointestinal: Negative for diarrhea.  Endocrine: Negative for cold intolerance.  Genitourinary: Negative for hematuria and difficulty urinating.  Musculoskeletal: Positive for back pain.  Skin: Negative for rash.  Allergic/Immunologic: Negative for environmental allergies.  Neurological: Negative for numbness.  Hematological: Does not bruise/bleed easily.  Psychiatric/Behavioral: Negative for dysphoric mood.       Objective:  Physical Exam VS: see vs page GEN: no distress HEAD: head: no deformity eyes: no periorbital swelling, no proptosis external nose and ears are normal mouth: no lesion seen NECK: supple, thyroid is not enlarged CHEST WALL: no deformity LUNGS: clear to auscultation BREASTS:  No gynecomastia CV: reg rate and rhythm, no murmur ABD: abdomen is soft, nontender.  no hepatosplenomegaly.  not distended.  no hernia RECTAL: normal external and internal exam.  heme neg. PROSTATE:  Normal size.  No nodule MUSCULOSKELETAL: muscle bulk and strength are grossly normal.  no obvious joint swelling.  gait is normal and steady EXTEMITIES: no deformity.  no ulcer on the feet.  feet are of normal  color and temp.  no edema PULSES: dorsalis pedis intact bilat.  no carotid bruit NEURO:  cn 2-12 grossly intact.   readily moves all 4's.  sensation is intact to touch on the feet  NODES:  None palpable at the neck PSYCH: alert, well-oriented.  Does not appear anxious nor depressed.  i reviewed electrocardiogram     Assessment & Plan:  Wellness visit today, with problems stable, except as noted. please consider these measures for your health:  minimize alcohol.  do not use tobacco products.  have a colonoscopy at least every 10 years from age 66.  keep firearms safely stored.  always use seat belts.  have working smoke alarms in your home.  see an eye doctor and dentist regularly.  never drive under the influence of alcohol or drugs (including prescription drugs).  those with fair skin should take precautions against the sun.     SEPARATE EVALUATION FOLLOWS--EACH PROBLEM HERE IS NEW, NOT RESPONDING TO TREATMENT, OR POSES SIGNIFICANT RISK TO THE PATIENT'S HEALTH: Pt states few mos of slight heartburn sensation in the chest, worst in the context of sleep, but no assoc dysphagia. HISTORY OF THE PRESENT ILLNESS: PAST MEDICAL HISTORY reviewed and up to date today REVIEW OF SYSTEMS: Denies weight loss and brbpr PHYSICAL EXAMINATION: VITAL SIGNS:  See vs page GENERAL: no distress Skin: numerous seborrheic keratoses of the trunk of the body LAB/XRAY RESULTS: Lab Results  Component Value Date   WBC 6.1 07/24/2013   HGB 15.6 07/24/2013   HCT 44.8 07/24/2013   MCV 89.1 07/24/2013   PLT 297.0 07/24/2013  IMPRESSION: Jerrye Bushy, new Seborrheic keratoses, persistent PLAN:  Ref derm i have sent a prescription to your pharmacy, for prilosec

## 2014-06-07 NOTE — Patient Instructions (Addendum)
Please do your blood tests after 07/24/14.   please consider these measures for your health:  minimize alcohol.  do not use tobacco products.  have a colonoscopy at least every 10 years from age 48.  keep firearms safely stored.  always use seat belts.  have working smoke alarms in your home.  see an eye doctor and dentist regularly.  never drive under the influence of alcohol or drugs (including prescription drugs).  those with fair skin should take precautions against the sun.   Please return in 1 year.   i have sent a prescription to your pharmacy, for the heartburn.   Please see a dermatology specialist.  you will receive a phone call, about a day and time for an appointment

## 2014-06-08 DIAGNOSIS — K219 Gastro-esophageal reflux disease without esophagitis: Secondary | ICD-10-CM | POA: Insufficient documentation

## 2014-06-13 ENCOUNTER — Emergency Department (HOSPITAL_BASED_OUTPATIENT_CLINIC_OR_DEPARTMENT_OTHER)
Admission: EM | Admit: 2014-06-13 | Discharge: 2014-06-13 | Disposition: A | Discharge status: Home / Self Care | Payer: 59 | Attending: Emergency Medicine | Admitting: Emergency Medicine

## 2014-06-13 ENCOUNTER — Encounter (HOSPITAL_BASED_OUTPATIENT_CLINIC_OR_DEPARTMENT_OTHER): Payer: Self-pay | Admitting: Emergency Medicine

## 2014-06-13 DIAGNOSIS — Z79899 Other long term (current) drug therapy: Secondary | ICD-10-CM | POA: Insufficient documentation

## 2014-06-13 DIAGNOSIS — T6391XA Toxic effect of contact with unspecified venomous animal, accidental (unintentional), initial encounter: Secondary | ICD-10-CM | POA: Insufficient documentation

## 2014-06-13 DIAGNOSIS — R42 Dizziness and giddiness: Secondary | ICD-10-CM | POA: Insufficient documentation

## 2014-06-13 DIAGNOSIS — Z8669 Personal history of other diseases of the nervous system and sense organs: Secondary | ICD-10-CM | POA: Diagnosis not present

## 2014-06-13 DIAGNOSIS — R55 Syncope and collapse: Secondary | ICD-10-CM | POA: Insufficient documentation

## 2014-06-13 DIAGNOSIS — Y929 Unspecified place or not applicable: Secondary | ICD-10-CM | POA: Insufficient documentation

## 2014-06-13 DIAGNOSIS — Y939 Activity, unspecified: Secondary | ICD-10-CM | POA: Diagnosis not present

## 2014-06-13 DIAGNOSIS — T63481A Toxic effect of venom of other arthropod, accidental (unintentional), initial encounter: Secondary | ICD-10-CM

## 2014-06-13 DIAGNOSIS — Z7982 Long term (current) use of aspirin: Secondary | ICD-10-CM | POA: Insufficient documentation

## 2014-06-13 DIAGNOSIS — T63461A Toxic effect of venom of wasps, accidental (unintentional), initial encounter: Secondary | ICD-10-CM | POA: Insufficient documentation

## 2014-06-13 NOTE — ED Notes (Signed)
Patient was stung by a yellow jack to his ear. The patient received zantac, benadryl en route with ems. The patient developed a rash after the sting and swelling in multiple areas. The patient has stable vital signs at this time.

## 2014-06-13 NOTE — ED Provider Notes (Signed)
CSN: [id number]     Arrival date & time 06/13/14  1942 History  This chart was scribed for Ephraim Hamburger, MD by Cathie Hoops, ED Scribe. The patient was seen in [place]. The patient's care was started at 8:39 PM.     Chief Complaint  Patient presents with  . Insect Bite   The history is provided by the patient. No language interpreter was used.   HPI Comments: Micheal Lawson is a 48 y.o. male who presents to the Emergency Department via EMS complaining of insect bite to the ear onset 2 hours ago. Patient was stung by several yellow jackets 5-6x to his left ear, neck, arms, bilaterally, legs and chin. Pt notes he had associated dizziness, tunnel vision, indigestion, and LOC shortly after the bite. Pt notes some SOB at the time of the incident that has since resolved. Pt had hives on his legs bilaterally and back that have since resolved after getting benadryl by EMS. Patient received zantac, zofran, and benadryl from EMS. Pt denies severe reaction to bees. Pt denies sore throat or difficulty swallowing. Feels normal besides pain in left ear.   Past Medical History  Diagnosis Date  . ALLERGIC RHINITIS 05/08/2007    Qualifier: Diagnosis of  By: Marca Ancona RMA, [name]    . ELEVATED BLOOD PRESSURE WITHOUT DIAGNOSIS OF HYPERTENSION 08/31/2008    Qualifier: Diagnosis of  By: Jenny Reichmann MD, Hunt Oris   . GLAUCOMA 04/01/2009    Qualifier: Diagnosis of  By: Loanne Drilling MD, Jacelyn Pi    Past Surgical History  Procedure Laterality Date  . Electrocardiogram  04/04/2007  . Cyst removal neck    . Shoulder arthroscopy with rotator cuff repair and subacromial decompression  09/26/2012    Procedure: SHOULDER ARTHROSCOPY WITH ROTATOR CUFF REPAIR AND SUBACROMIAL DECOMPRESSION;  Surgeon: Yvette Rack., MD;  Location: [place];  Service: Orthopedics;  Laterality: Right;  RIGHT SHOULDER ARTHROSCOPY WITH EXTENSIVE DEBRIDEMENT, SUBACROMIAL DECOMPRESSION, PARTIAL ACROMIOPLASTY WITH CORACROMIAL RELEASE   Family History  Problem  Relation Age of Onset  . Heart disease Father     CABG 81  . Leukemia Sister    History  Substance Use Topics  . Smoking status: Never Smoker   . Smokeless tobacco: Not on file  . Alcohol Use: Yes     Comment: occ    Review of Systems  HENT: Negative for sore throat and trouble swallowing.   Respiratory: Negative for shortness of breath.   Gastrointestinal: Negative for vomiting.  Skin: Positive for rash.  Neurological: Positive for dizziness and syncope.  All other systems reviewed and are negative.  Allergies  Review of patient's allergies indicates no known allergies.  Home Medications   Prior to Admission medications   Medication Sig Start Date End Date Taking? Authorizing Provider  aspirin EC 81 MG tablet Take 81 mg by mouth daily.    Historical Provider, MD  cetirizine (ZYRTEC) 10 MG tablet Take 10 mg by mouth daily.    Historical Provider, MD  dorzolamide-timolol (COSOPT) 22.3-6.8 MG/ML ophthalmic solution Place 1 drop into both eyes 2 (two) times daily.    Historical Provider, MD  ibuprofen (ADVIL,MOTRIN) 200 MG tablet Take 400-600 mg by mouth every 6 (six) hours as needed. As needed for pain.    Historical Provider, MD  omeprazole (PRILOSEC) 40 MG capsule Take 1 capsule (40 mg total) by mouth daily. 06/07/14   Renato Shin, MD  Travoprost, BAK Free, (TRAVATAN) 0.004 % SOLN ophthalmic solution Place 1 drop into both  eyes daily.    Historical Provider, MD   Triage Vitals: BP 126/81  Pulse 71  Temp(Src) 98 F (36.7 C) (Oral)  SpO2 97% Physical Exam  Nursing note and vitals reviewed. Constitutional: He is oriented to person, place, and time. He appears well-developed and well-nourished. No distress.  HENT:  Head: Normocephalic and atraumatic.  Right Ear: External ear normal.  No oropharyngeal swelling. No tongue swelling. Swelling to the left pinna.   Neck: Neck supple. No tracheal deviation present.  Cardiovascular: Normal rate, regular rhythm and normal heart  sounds.   Pulmonary/Chest: Effort normal and breath sounds normal. No stridor. No respiratory distress. He has no wheezes.  Abdominal: He exhibits no distension.  Musculoskeletal:  Localized insect sting to his left neck. Local sting reaction to left forearm.  Neurological: He is alert and oriented to person, place, and time.  Skin: Skin is warm and dry. No rash noted.  Psychiatric: He has a normal mood and affect. His behavior is normal.    ED Course  Procedures (including critical care time) DIAGNOSTIC STUDIES: Oxygen Saturation is 97% on RA, adequate by my interpretation.    COORDINATION OF CARE: 8:54 PM- Patient informed of current plan for treatment and evaluation and agrees with plan at this time.    Labs Review Labs Reviewed - No data to display  Imaging Review No results found.   EKG Interpretation None      MDM   Final diagnoses:  Insect sting, accidental or unintentional, initial encounter  Allergic reaction to insect sting, accidental or unintentional, initial encounter    Patient with multiple bee stings that apparently caused transient hives relieved with Benadryl by EMS. He has no hives currently. He did syncopize after the stings but but this appears to be more vasovagal as he did not have any wheezing, shortness of breath, and recovered spontaneously without any current symptoms. I have low suspicion that this anaphylaxis. Stable for discharge.  This chart was scribed in my presence and reviewed by me personally.   Ephraim Hamburger, MD 06/13/14 [phone]

## 2014-06-13 NOTE — Discharge Instructions (Signed)
Allergies °Allergies may happen from anything your body is sensitive to. This may be food, medicines, pollens, chemicals, and nearly anything around you in everyday life that produces allergens. An allergen is anything that causes an allergy producing substance. Heredity is often a factor in causing these problems. This means you may have some of the same allergies as your parents. °Food allergies happen in all age groups. Food allergies are some of the most severe and life threatening. Some common food allergies are cow's milk, seafood, eggs, nuts, wheat, and soybeans. °SYMPTOMS  °· Swelling around the mouth. °· An itchy red rash or hives. °· Vomiting or diarrhea. °· Difficulty breathing. °SEVERE ALLERGIC REACTIONS ARE LIFE-THREATENING. °This reaction is called anaphylaxis. It can cause the mouth and throat to swell and cause difficulty with breathing and swallowing. In severe reactions only a trace amount of food (for example, peanut oil in a salad) may cause death within seconds. °Seasonal allergies occur in all age groups. These are seasonal because they usually occur during the same season every year. They may be a reaction to molds, grass pollens, or tree pollens. Other causes of problems are house dust mite allergens, pet dander, and mold spores. The symptoms often consist of nasal congestion, a runny itchy nose associated with sneezing, and tearing itchy eyes. There is often an associated itching of the mouth and ears. The problems happen when you come in contact with pollens and other allergens. Allergens are the particles in the air that the body reacts to with an allergic reaction. This causes you to release allergic antibodies. Through a chain of events, these eventually cause you to release histamine into the blood stream. Although it is meant to be protective to the body, it is this release that causes your discomfort. This is why you were given anti-histamines to feel better.  If you are unable to  pinpoint the offending allergen, it may be determined by skin or blood testing. Allergies cannot be cured but can be controlled with medicine. °Hay fever is a collection of all or some of the seasonal allergy problems. It may often be treated with simple over-the-counter medicine such as diphenhydramine. Take medicine as directed. Do not drink alcohol or drive while taking this medicine. Check with your caregiver or package insert for child dosages. °If these medicines are not effective, there are many new medicines your caregiver can prescribe. Stronger medicine such as nasal spray, eye drops, and corticosteroids may be used if the first things you try do not work well. Other treatments such as immunotherapy or desensitizing injections can be used if all else fails. Follow up with your caregiver if problems continue. These seasonal allergies are usually not life threatening. They are generally more of a nuisance that can often be handled using medicine. °HOME CARE INSTRUCTIONS  °· If unsure what causes a reaction, keep a diary of foods eaten and symptoms that follow. Avoid foods that cause reactions. °· If hives or rash are present: °¨ Take medicine as directed. °¨ You may use an over-the-counter antihistamine (diphenhydramine) for hives and itching as needed. °¨ Apply cold compresses (cloths) to the skin or take baths in cool water. Avoid hot baths or showers. Heat will make a rash and itching worse. °· If you are severely allergic: °¨ Following a treatment for a severe reaction, hospitalization is often required for closer follow-up. °¨ Wear a medic-alert bracelet or necklace stating the allergy. °¨ You and your family must learn how to give adrenaline or use   an anaphylaxis kit.  If you have had a severe reaction, always carry your anaphylaxis kit or EpiPen with you. Use this medicine as directed by your caregiver if a severe reaction is occurring. Failure to do so could have a fatal outcome. SEEK MEDICAL  CARE IF:  You suspect a food allergy. Symptoms generally happen within 30 minutes of eating a food.  Your symptoms have not gone away within 2 days or are getting worse.  You develop new symptoms.  You want to retest yourself or your child with a food or drink you think causes an allergic reaction. Never do this if an anaphylactic reaction to that food or drink has happened before. Only do this under the care of a caregiver. SEEK IMMEDIATE MEDICAL CARE IF:   You have difficulty breathing, are wheezing, or have a tight feeling in your chest or throat.  You have a swollen mouth, or you have hives, swelling, or itching all over your body.  You have had a severe reaction that has responded to your anaphylaxis kit or an EpiPen. These reactions may return when the medicine has worn off. These reactions should be considered life threatening. MAKE SURE YOU:   Understand these instructions.  Will watch your condition.  Will get help right away if you are not doing well or get worse. Document Released: 12/18/2002 Document Revised: 01/19/2013 Document Reviewed: 05/24/2008 [facility] Patient Information 2015 [place], [place]. This information is not intended to replace advice given to you by your health care provider. Make sure you discuss any questions you have with your health care provider.    Insect Bite Mosquitoes, flies, fleas, bedbugs, and many other insects can bite. Insect bites are different from insect stings. A sting is when venom is injected into the skin. Some insect bites can transmit infectious diseases. SYMPTOMS  Insect bites usually turn red, swell, and itch for 2 to 4 days. They often go away on their own. TREATMENT  Your caregiver may prescribe antibiotic medicines if a bacterial infection develops in the bite. HOME CARE INSTRUCTIONS Do not scratch the bite area. Keep the bite area clean and dry. Wash the bite area thoroughly with soap and water. Put ice or cool compresses  on the bite area. Put ice in a plastic bag. Place a towel between your skin and the bag. Leave the ice on for 20 minutes, 4 times a day for the first 2 to 3 days, or as directed. You may apply a baking soda paste, cortisone cream, or calamine lotion to the bite area as directed by your caregiver. This can help reduce itching and swelling. Only take over-the-counter or prescription medicines as directed by your caregiver. If you are given antibiotics, take them as directed. Finish them even if you start to feel better. You may need a tetanus shot if: You cannot remember when you had your last tetanus shot. You have never had a tetanus shot. The injury broke your skin. If you get a tetanus shot, your arm may swell, get red, and feel warm to the touch. This is common and not a problem. If you need a tetanus shot and you choose not to have one, there is a rare chance of getting tetanus. Sickness from tetanus can be serious. SEEK IMMEDIATE MEDICAL CARE IF:  You have increased pain, redness, or swelling in the bite area. You see a red line on the skin coming from the bite. You have a fever. You have joint pain. You have a headache  or neck pain. You have unusual weakness. You have a rash. You have chest pain or shortness of breath. You have abdominal pain, nausea, or vomiting. You feel unusually tired or sleepy. MAKE SURE YOU:  Understand these instructions. Will watch your condition. Will get help right away if you are not doing well or get worse. Document Released: 11/01/2004 Document Revised: 12/17/2011 Document Reviewed: 04/25/2011 [facility] Patient Information 2015 [place], [place]. This information is not intended to replace advice given to you by your health care provider. Make sure you discuss any questions you have with your health care provider.

## 2014-08-20 ENCOUNTER — Encounter: Payer: Self-pay | Admitting: Endocrinology

## 2014-09-20 ENCOUNTER — Other Ambulatory Visit: Payer: Self-pay | Admitting: Physician Assistant

## 2014-11-30 ENCOUNTER — Encounter: Payer: Self-pay | Admitting: Endocrinology

## 2014-11-30 ENCOUNTER — Ambulatory Visit (INDEPENDENT_AMBULATORY_CARE_PROVIDER_SITE_OTHER): Payer: 59 | Admitting: Endocrinology

## 2014-11-30 VITALS — BP 134/80 | HR 74 | Temp 98.7°F | Ht 73.0 in | Wt 198.0 lb

## 2014-11-30 DIAGNOSIS — Z0000 Encounter for general adult medical examination without abnormal findings: Secondary | ICD-10-CM

## 2014-11-30 DIAGNOSIS — Z0189 Encounter for other specified special examinations: Secondary | ICD-10-CM

## 2014-11-30 DIAGNOSIS — Z1283 Encounter for screening for malignant neoplasm of skin: Secondary | ICD-10-CM | POA: Insufficient documentation

## 2014-11-30 DIAGNOSIS — Z125 Encounter for screening for malignant neoplasm of prostate: Secondary | ICD-10-CM

## 2014-11-30 LAB — HEPATIC FUNCTION PANEL
ALBUMIN: 4.5 g/dL (ref 3.5–5.2)
ALT: 35 U/L (ref 0–53)
AST: 19 U/L (ref 0–37)
Alkaline Phosphatase: 90 U/L (ref 39–117)
BILIRUBIN DIRECT: 0.1 mg/dL (ref 0.0–0.3)
Total Bilirubin: 0.6 mg/dL (ref 0.2–1.2)
Total Protein: 7.2 g/dL (ref 6.0–8.3)

## 2014-11-30 LAB — CBC WITH DIFFERENTIAL/PLATELET
Basophils Absolute: 0 10*3/uL (ref 0.0–0.1)
Basophils Relative: 0.6 % (ref 0.0–3.0)
EOS ABS: 0.3 10*3/uL (ref 0.0–0.7)
Eosinophils Relative: 3.5 % (ref 0.0–5.0)
HCT: 43.4 % (ref 39.0–52.0)
Hemoglobin: 15.1 g/dL (ref 13.0–17.0)
LYMPHS ABS: 2.7 10*3/uL (ref 0.7–4.0)
Lymphocytes Relative: 30.5 % (ref 12.0–46.0)
MCHC: 34.8 g/dL (ref 30.0–36.0)
MCV: 86.8 fl (ref 78.0–100.0)
MONO ABS: 0.9 10*3/uL (ref 0.1–1.0)
Monocytes Relative: 10.3 % (ref 3.0–12.0)
NEUTROS PCT: 55.1 % (ref 43.0–77.0)
Neutro Abs: 4.8 10*3/uL (ref 1.4–7.7)
PLATELETS: 339 10*3/uL (ref 150.0–400.0)
RBC: 5 Mil/uL (ref 4.22–5.81)
RDW: 12.4 % (ref 11.5–15.5)
WBC: 8.7 10*3/uL (ref 4.0–10.5)

## 2014-11-30 LAB — BASIC METABOLIC PANEL
BUN: 15 mg/dL (ref 6–23)
CALCIUM: 9.6 mg/dL (ref 8.4–10.5)
CO2: 30 meq/L (ref 19–32)
CREATININE: 0.95 mg/dL (ref 0.40–1.50)
Chloride: 103 mEq/L (ref 96–112)
GFR: 89.64 mL/min (ref >60.00)
Glucose, Bld: 96 mg/dL (ref 70–99)
Potassium: 3.9 mEq/L (ref 3.5–5.1)
Sodium: 138 mEq/L (ref 135–145)

## 2014-11-30 LAB — PSA: PSA: 0.86 ng/mL (ref 0.10–4.00)

## 2014-11-30 LAB — URINALYSIS, ROUTINE W REFLEX MICROSCOPIC
BILIRUBIN URINE: NEGATIVE
HGB URINE DIPSTICK: NEGATIVE
Ketones, ur: NEGATIVE
LEUKOCYTES UA: NEGATIVE
NITRITE: NEGATIVE
RBC / HPF: NONE SEEN (ref 0–?)
Specific Gravity, Urine: 1.015 (ref 1.000–1.030)
Total Protein, Urine: NEGATIVE
UROBILINOGEN UA: 1 (ref 0.0–1.0)
Urine Glucose: NEGATIVE
pH: 7 (ref 5.0–8.0)

## 2014-11-30 LAB — LIPID PANEL
CHOL/HDL RATIO: 4
CHOLESTEROL: 161 mg/dL (ref 0–200)
HDL: 37.6 mg/dL — AB (ref >39.00)
NonHDL: 123.4
TRIGLYCERIDES: 391 mg/dL — AB (ref 0.0–149.0)
VLDL: 78.2 mg/dL — ABNORMAL HIGH (ref 0.0–40.0)

## 2014-11-30 LAB — TSH: TSH: 1.59 u[IU]/mL (ref 0.35–4.50)

## 2014-11-30 LAB — LDL CHOLESTEROL, DIRECT: Direct LDL: 94 mg/dL

## 2014-11-30 MED ORDER — CEFUROXIME AXETIL 250 MG PO TABS
250.0000 mg | ORAL_TABLET | Freq: Two times a day (BID) | ORAL | Status: AC
Start: 2014-11-30 — End: 2014-12-10

## 2014-11-30 NOTE — Patient Instructions (Addendum)
i have sent a prescription to your pharmacy, for an antibiotic pill. zyrtec-d (non-prescription) will help your congestion. I hope you feel better soon.  If you don't feel better by next week, please call back.  Please call sooner if you get worse.   blood tests are being requested for you today.  We'll let you know about the results.

## 2014-11-30 NOTE — Progress Notes (Signed)
Subjective:    Patient ID: Micheal Lawson, male    DOB: 1966-01-05, 49 y.o.   MRN: [id number]  HPI Pt states 1 week of moderate congestion in the nose, but no assoc fever Past Medical History  Diagnosis Date  . ALLERGIC RHINITIS 05/08/2007    Qualifier: Diagnosis of  By: Marca Ancona RMA, [name]    . ELEVATED BLOOD PRESSURE WITHOUT DIAGNOSIS OF HYPERTENSION 08/31/2008    Qualifier: Diagnosis of  By: Jenny Reichmann MD, Hunt Oris   . GLAUCOMA 04/01/2009    Qualifier: Diagnosis of  By: Loanne Drilling MD, Jacelyn Pi     Past Surgical History  Procedure Laterality Date  . Electrocardiogram  04/04/2007  . Cyst removal neck    . Shoulder arthroscopy with rotator cuff repair and subacromial decompression  09/26/2012    Procedure: SHOULDER ARTHROSCOPY WITH ROTATOR CUFF REPAIR AND SUBACROMIAL DECOMPRESSION;  Surgeon: Yvette Rack., MD;  Location: [place];  Service: Orthopedics;  Laterality: Right;  RIGHT SHOULDER ARTHROSCOPY WITH EXTENSIVE DEBRIDEMENT, SUBACROMIAL DECOMPRESSION, PARTIAL ACROMIOPLASTY WITH CORACROMIAL RELEASE    History   Social History  . Marital Status: Married    Spouse Name: N/A  . Number of Children: N/A  . Years of Education: N/A   Occupational History  . 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 Smoking status: Never Smoker   . Smokeless tobacco: Not on file  . Alcohol Use: Yes     Comment: occ  . Drug Use: Not on file  . Sexual Activity: Not on file   Other Topics Concern  . Not on file   Social History Narrative    Current Outpatient Prescriptions on File Prior to Visit  Medication Sig Dispense Refill  . aspirin EC 81 MG tablet Take 81 mg by mouth daily.    . cetirizine (ZYRTEC) 10 MG tablet Take 10 mg by mouth daily.    . dorzolamide-timolol (COSOPT) 22.3-6.8 MG/ML ophthalmic solution Place 1 drop into both eyes 2 (two) times daily.    Marland Kitchen ibuprofen (ADVIL,MOTRIN) 200 MG tablet Take 400-600 mg by mouth every 6 (six) hours as needed. As needed for pain.      Marland Kitchen omeprazole (PRILOSEC) 40 MG capsule Take 1 capsule (40 mg total) by mouth daily. 90 capsule 3  . Travoprost, BAK Free, (TRAVATAN) 0.004 % SOLN ophthalmic solution Place 1 drop into both eyes daily.     No current facility-administered medications on file prior to visit.    No Known Allergies  Family History  Problem Relation Age of Onset  . Heart disease Father     CABG 62  . Leukemia Sister     BP 134/80 mmHg  Pulse 74  Temp(Src) 98.7 F (37.1 C) (Oral)  Ht 6\' 1"  (1.854 m)  Wt 198 lb (89.812 kg)  BMI 26.13 kg/m2  SpO2 94%  Review of Systems Denies earache.  He has a slight prod cough.    Objective:   Physical Exam VITAL SIGNS:  See vs page GENERAL: no distress head: no deformity eyes: no periorbital swelling, no proptosis external nose and ears are normal mouth: no lesion seen Both tm's are red LUNGS:  Clear to auscultation       Assessment & Plan:  URI: new   Patient is advised the following: Patient Instructions  i have sent a prescription to your pharmacy, for an antibiotic pill. zyrtec-d (non-prescription) will help your congestion. I hope you feel better soon.  If you don't  feel better by next week, please call back.  Please call sooner if you get worse.   blood tests are being requested for you today.  We'll let you know about the results.

## 2015-08-17 ENCOUNTER — Ambulatory Visit (INDEPENDENT_AMBULATORY_CARE_PROVIDER_SITE_OTHER): Payer: 59 | Admitting: Endocrinology

## 2015-08-17 ENCOUNTER — Encounter: Payer: Self-pay | Admitting: Endocrinology

## 2015-08-17 VITALS — BP 128/64 | HR 69 | Temp 97.6°F | Ht 73.0 in | Wt 193.0 lb

## 2015-08-17 DIAGNOSIS — J069 Acute upper respiratory infection, unspecified: Secondary | ICD-10-CM | POA: Diagnosis not present

## 2015-08-17 MED ORDER — CEFUROXIME AXETIL 250 MG PO TABS: 250.0000 mg | ORAL_TABLET | Freq: Two times a day (BID) | ORAL | Status: AC

## 2015-08-17 MED ORDER — TRAZODONE HCL 50 MG PO TABS: 50.0000 mg | ORAL_TABLET | Freq: Every day | ORAL | Status: DC

## 2015-08-17 MED ORDER — PROMETHAZINE-CODEINE 6.25-10 MG/5ML PO SYRP: 5.0000 mL | ORAL_SOLUTION | ORAL | Status: DC | PRN

## 2015-08-17 NOTE — Patient Instructions (Addendum)
i have sent a prescription to your pharmacy, for an antibiotic pill Here is a prescription for cough syrup.  Loratadine-d (non-prescription) will help your congestion.  I hope you feel better soon.  If you don't feel better by next week, please call back.  Please call sooner if you get worse.   i have also sent a prescription to your pharmacy, for sleep.Insomnia Insomnia is a sleep disorder that makes it difficult to fall asleep or to stay asleep. Insomnia can cause tiredness (fatigue), low energy, difficulty concentrating, mood swings, and poor performance at work or school.  There are three different ways to classify insomnia:  Difficulty falling asleep.  Difficulty staying asleep.  Waking up too early in the morning. Any type of insomnia can be long-term (chronic) or short-term (acute). Both are common. Short-term insomnia usually lasts for three months or less. Chronic insomnia occurs at least three times a week for longer than three months. CAUSES  Insomnia may be caused by another condition, situation, or substance, such as:  Anxiety.  Certain medicines.  Gastroesophageal reflux disease (GERD) or other gastrointestinal conditions.  Asthma or other breathing conditions.  Restless legs syndrome, sleep apnea, or other sleep disorders.  Chronic pain.  Menopause. This may include hot flashes.  Stroke.  Abuse of alcohol, tobacco, or illegal drugs.  Depression.  Caffeine.   Neurological disorders, such as Alzheimer disease.  An overactive thyroid (hyperthyroidism). The cause of insomnia may not be known. RISK FACTORS Risk factors for insomnia include:  Gender. Women are more commonly affected than men.  Age. Insomnia is more common as you get older.  Stress. This may involve your professional or personal life.  Income. Insomnia is more common in people with lower income.  Lack of exercise.   Irregular work schedule or night shifts.  Traveling between  different time zones. SIGNS AND SYMPTOMS If you have insomnia, trouble falling asleep or trouble staying asleep is the main symptom. This may lead to other symptoms, such as:  Feeling fatigued.  Feeling nervous about going to sleep.  Not feeling rested in the morning.  Having trouble concentrating.  Feeling irritable, anxious, or depressed. TREATMENT  Treatment for insomnia depends on the cause. If your insomnia is caused by an underlying condition, treatment will focus on addressing the condition. Treatment may also include:   Medicines to help you sleep.  Counseling or therapy.  Lifestyle adjustments. HOME CARE INSTRUCTIONS   Take medicines only as directed by your health care provider.  Keep regular sleeping and waking hours. Avoid naps.  Keep a sleep diary to help you and your health care provider figure out what could be causing your insomnia. Include:   When you sleep.  When you wake up during the night.  How well you sleep.   How rested you feel the next day.  Any side effects of medicines you are taking.  What you eat and drink.   Make your bedroom a comfortable place where it is easy to fall asleep:  Put up shades or special blackout curtains to block light from outside.  Use a white noise machine to block noise.  Keep the temperature cool.   Exercise regularly as directed by your health care provider. Avoid exercising right before bedtime.  Use relaxation techniques to manage stress. Ask your health care provider to suggest some techniques that may work well for you. These may include:  Breathing exercises.  Routines to release muscle tension.  Visualizing peaceful scenes.  Cut back  on alcohol, caffeinated beverages, and cigarettes, especially close to bedtime. These can disrupt your sleep.  Do not overeat or eat spicy foods right before bedtime. This can lead to digestive discomfort that can make it hard for you to sleep.  Limit screen use  before bedtime. This includes:  Watching TV.  Using your smartphone, tablet, and computer.  Stick to a routine. This can help you fall asleep faster. Try to do a quiet activity, brush your teeth, and go to bed at the same time each night.  Get out of bed if you are still awake after 15 minutes of trying to sleep. Keep the lights down, but try reading or doing a quiet activity. When you feel sleepy, go back to bed.  Make sure that you drive carefully. Avoid driving if you feel very sleepy.  Keep all follow-up appointments as directed by your health care provider. This is important. SEEK MEDICAL CARE IF:   You are tired throughout the day or have trouble in your daily routine due to sleepiness.  You continue to have sleep problems or your sleep problems get worse. SEEK IMMEDIATE MEDICAL CARE IF:   You have serious thoughts about hurting yourself or someone else.   This information is not intended to replace advice given to you by your health care provider. Make sure you discuss any questions you have with your health care provider.   Document Released: 09/21/2000 Document Revised: 06/15/2015 Document Reviewed: 06/25/2014 Elsevier Interactive Patient Education [employer].

## 2015-08-17 NOTE — Progress Notes (Signed)
Subjective:    Patient ID: Micheal Lawson, male    DOB: Nov 19, 1965, 49 y.o.   MRN: [id number]  HPI Pt states few days of moderate prod-quality cough in the chest, and assoc nasal congestion.   Past Medical History  Diagnosis Date  . ALLERGIC RHINITIS 05/08/2007    Qualifier: Diagnosis of  By: Marca Ancona RMA, [name]    . ELEVATED BLOOD PRESSURE WITHOUT DIAGNOSIS OF HYPERTENSION 08/31/2008    Qualifier: Diagnosis of  By: Jenny Reichmann MD, Hunt Oris   . GLAUCOMA 04/01/2009    Qualifier: Diagnosis of  By: Loanne Drilling MD, Jacelyn Pi     Past Surgical History  Procedure Laterality Date  . Electrocardiogram  04/04/2007  . Cyst removal neck    . Shoulder arthroscopy with rotator cuff repair and subacromial decompression  09/26/2012    Procedure: SHOULDER ARTHROSCOPY WITH ROTATOR CUFF REPAIR AND SUBACROMIAL DECOMPRESSION;  Surgeon: Yvette Rack., MD;  Location: [facility];  Service: Orthopedics;  Laterality: Right;  RIGHT SHOULDER ARTHROSCOPY WITH EXTENSIVE DEBRIDEMENT, SUBACROMIAL DECOMPRESSION, PARTIAL ACROMIOPLASTY WITH CORACROMIAL RELEASE    Social History   Social History  . Marital Status: Married    Spouse Name: N/A  . Number of Children: N/A  . Years of Education: N/A   Occupational History  . 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 Smoking status: Never Smoker   . Smokeless tobacco: Not on file  . Alcohol Use: Yes     Comment: occ  . Drug Use: Not on file  . Sexual Activity: Not on file   Other Topics Concern  . Not on file   Social History Narrative    Current Outpatient Prescriptions on File Prior to Visit  Medication Sig Dispense Refill  . aspirin EC 81 MG tablet Take 81 mg by mouth daily.    . cetirizine (ZYRTEC) 10 MG tablet Take 10 mg by mouth daily.    . dorzolamide-timolol (COSOPT) 22.3-6.8 MG/ML ophthalmic solution Place 1 drop into both eyes 2 (two) times daily.    Marland Kitchen ibuprofen (ADVIL,MOTRIN) 200 MG tablet Take 400-600 mg by mouth every 6 (six) hours as  needed. As needed for pain.    Marland Kitchen omeprazole (PRILOSEC) 40 MG capsule Take 1 capsule (40 mg total) by mouth daily. 90 capsule 3  . Travoprost, BAK Free, (TRAVATAN) 0.004 % SOLN ophthalmic solution Place 1 drop into both eyes daily.     No current facility-administered medications on file prior to visit.    No Known Allergies  Family History  Problem Relation Age of Onset  . Heart disease Father     CABG 37  . Leukemia Sister     BP 128/64 mmHg  Pulse 69  Temp(Src) 97.6 F (36.4 C) (Oral)  Ht 6\' 1"  (1.854 m)  Wt 193 lb (87.544 kg)  BMI 25.47 kg/m2  SpO2 97% Review of Systems Denies fever, sob, and earache.  He has insomnia    Objective:   Physical Exam VITAL SIGNS:  See vs page GENERAL: no distress.  head: no deformity eyes: no periorbital swelling, no proptosis external nose and ears are normal mouth: no lesion seen, but pharynx is red Both tm's are red LUNGS:  Clear to auscultation. PSYCH: Does not appear anxious nor depressed.    Lab Results  Component Value Date   TSH 1.59 11/30/2014      Assessment & Plan:  URI: new Insomnia: new.  Patient is advised the following: Patient Instructions  i have sent a prescription to your pharmacy, for an antibiotic pill Here is a prescription for cough syrup.  Loratadine-d (non-prescription) will help your congestion.  I hope you feel better soon.  If you don't feel better by next week, please call back.  Please call sooner if you get worse.   i have also sent a prescription to your pharmacy, for sleep.Insomnia Insomnia is a sleep disorder that makes it difficult to fall asleep or to stay asleep. Insomnia can cause tiredness (fatigue), low energy, difficulty concentrating, mood swings, and poor performance at work or school.  There are three different ways to classify insomnia:  Difficulty falling asleep.  Difficulty staying asleep.  Waking up too early in the morning. Any type of insomnia can be long-term (chronic)  or short-term (acute). Both are common. Short-term insomnia usually lasts for three months or less. Chronic insomnia occurs at least three times a week for longer than three months. CAUSES  Insomnia may be caused by another condition, situation, or substance, such as:  Anxiety.  Certain medicines.  Gastroesophageal reflux disease (GERD) or other gastrointestinal conditions.  Asthma or other breathing conditions.  Restless legs syndrome, sleep apnea, or other sleep disorders.  Chronic pain.  Menopause. This may include hot flashes.  Stroke.  Abuse of alcohol, tobacco, or illegal drugs.  Depression.  Caffeine.   Neurological disorders, such as Alzheimer disease.  An overactive thyroid (hyperthyroidism). The cause of insomnia may not be known. RISK FACTORS Risk factors for insomnia include:  Gender. Women are more commonly affected than men.  Age. Insomnia is more common as you get older.  Stress. This may involve your professional or personal life.  Income. Insomnia is more common in people with lower income.  Lack of exercise.   Irregular work schedule or night shifts.  Traveling between different time zones. SIGNS AND SYMPTOMS If you have insomnia, trouble falling asleep or trouble staying asleep is the main symptom. This may lead to other symptoms, such as:  Feeling fatigued.  Feeling nervous about going to sleep.  Not feeling rested in the morning.  Having trouble concentrating.  Feeling irritable, anxious, or depressed. TREATMENT  Treatment for insomnia depends on the cause. If your insomnia is caused by an underlying condition, treatment will focus on addressing the condition. Treatment may also include:   Medicines to help you sleep.  Counseling or therapy.  Lifestyle adjustments. HOME CARE INSTRUCTIONS   Take medicines only as directed by your health care provider.  Keep regular sleeping and waking hours. Avoid naps.  Keep a sleep diary  to help you and your health care provider figure out what could be causing your insomnia. Include:   When you sleep.  When you wake up during the night.  How well you sleep.   How rested you feel the next day.  Any side effects of medicines you are taking.  What you eat and drink.   Make your bedroom a comfortable place where it is easy to fall asleep:  Put up shades or special blackout curtains to block light from outside.  Use a white noise machine to block noise.  Keep the temperature cool.   Exercise regularly as directed by your health care provider. Avoid exercising right before bedtime.  Use relaxation techniques to manage stress. Ask your health care provider to suggest some techniques that may work well for you. These may include:  Breathing exercises.  Routines to release muscle tension.  Visualizing peaceful scenes.  Cut  back on alcohol, caffeinated beverages, and cigarettes, especially close to bedtime. These can disrupt your sleep.  Do not overeat or eat spicy foods right before bedtime. This can lead to digestive discomfort that can make it hard for you to sleep.  Limit screen use before bedtime. This includes:  Watching TV.  Using your smartphone, tablet, and computer.  Stick to a routine. This can help you fall asleep faster. Try to do a quiet activity, brush your teeth, and go to bed at the same time each night.  Get out of bed if you are still awake after 15 minutes of trying to sleep. Keep the lights down, but try reading or doing a quiet activity. When you feel sleepy, go back to bed.  Make sure that you drive carefully. Avoid driving if you feel very sleepy.  Keep all follow-up appointments as directed by your health care provider. This is important. SEEK MEDICAL CARE IF:   You are tired throughout the day or have trouble in your daily routine due to sleepiness.  You continue to have sleep problems or your sleep problems get worse. SEEK  IMMEDIATE MEDICAL CARE IF:   You have serious thoughts about hurting yourself or someone else.   This information is not intended to replace advice given to you by your health care provider. Make sure you discuss any questions you have with your health care provider.   Document Released: 09/21/2000 Document Revised: 06/15/2015 Document Reviewed: 06/25/2014 Elsevier Interactive Patient Education [employer].

## 2015-10-25 ENCOUNTER — Encounter: Payer: Self-pay | Admitting: Endocrinology

## 2015-10-25 ENCOUNTER — Other Ambulatory Visit: Payer: Self-pay

## 2015-10-25 MED ORDER — OMEPRAZOLE 40 MG PO CPDR: 40.0000 mg | DELAYED_RELEASE_CAPSULE | Freq: Every day | ORAL | Status: DC

## 2016-01-27 DIAGNOSIS — H401122 Primary open-angle glaucoma, left eye, moderate stage: Secondary | ICD-10-CM | POA: Diagnosis not present

## 2016-01-27 DIAGNOSIS — H401111 Primary open-angle glaucoma, right eye, mild stage: Secondary | ICD-10-CM | POA: Diagnosis not present

## 2016-02-15 ENCOUNTER — Encounter: Payer: Self-pay | Admitting: Endocrinology

## 2016-04-11 ENCOUNTER — Encounter: Payer: Self-pay | Admitting: Endocrinology

## 2016-04-11 ENCOUNTER — Other Ambulatory Visit: Payer: 59

## 2016-04-13 ENCOUNTER — Other Ambulatory Visit: Payer: Self-pay

## 2016-04-13 DIAGNOSIS — Z0000 Encounter for general adult medical examination without abnormal findings: Secondary | ICD-10-CM

## 2016-04-16 ENCOUNTER — Encounter: Payer: Self-pay | Admitting: Endocrinology

## 2016-04-16 ENCOUNTER — Ambulatory Visit (INDEPENDENT_AMBULATORY_CARE_PROVIDER_SITE_OTHER): Payer: 59 | Admitting: Endocrinology

## 2016-04-16 VITALS — BP 138/88 | HR 53 | Ht 73.0 in | Wt 192.0 lb

## 2016-04-16 DIAGNOSIS — R7989 Other specified abnormal findings of blood chemistry: Secondary | ICD-10-CM | POA: Diagnosis not present

## 2016-04-16 DIAGNOSIS — Z0000 Encounter for general adult medical examination without abnormal findings: Secondary | ICD-10-CM | POA: Diagnosis not present

## 2016-04-16 DIAGNOSIS — Z0189 Encounter for other specified special examinations: Secondary | ICD-10-CM | POA: Diagnosis not present

## 2016-04-16 LAB — CBC WITH DIFFERENTIAL/PLATELET
BASOS PCT: 0.9 % (ref 0.0–3.0)
Basophils Absolute: 0 10*3/uL (ref 0.0–0.1)
EOS ABS: 0.1 10*3/uL (ref 0.0–0.7)
Eosinophils Relative: 2.7 % (ref 0.0–5.0)
HEMATOCRIT: 42.7 % (ref 39.0–52.0)
Hemoglobin: 14.7 g/dL (ref 13.0–17.0)
LYMPHS PCT: 43.1 % (ref 12.0–46.0)
Lymphs Abs: 2.2 10*3/uL (ref 0.7–4.0)
MCHC: 34.5 g/dL (ref 30.0–36.0)
MCV: 88.8 fl (ref 78.0–100.0)
MONO ABS: 0.7 10*3/uL (ref 0.1–1.0)
Monocytes Relative: 13.4 % — ABNORMAL HIGH (ref 3.0–12.0)
NEUTROS ABS: 2.1 10*3/uL (ref 1.4–7.7)
Neutrophils Relative %: 39.9 % — ABNORMAL LOW (ref 43.0–77.0)
PLATELETS: 297 10*3/uL (ref 150.0–400.0)
RBC: 4.82 Mil/uL (ref 4.22–5.81)
RDW: 12.4 % (ref 11.5–15.5)
WBC: 5.2 10*3/uL (ref 4.0–10.5)

## 2016-04-16 LAB — BASIC METABOLIC PANEL
BUN: 21 mg/dL (ref 6–23)
CHLORIDE: 103 meq/L (ref 96–112)
CO2: 29 meq/L (ref 19–32)
CREATININE: 1.08 mg/dL (ref 0.40–1.50)
Calcium: 9.4 mg/dL (ref 8.4–10.5)
GFR: 76.87 mL/min (ref >60.00)
Glucose, Bld: 94 mg/dL (ref 70–99)
Potassium: 4.2 mEq/L (ref 3.5–5.1)
Sodium: 138 mEq/L (ref 135–145)

## 2016-04-16 LAB — LIPID PANEL
CHOL/HDL RATIO: 4
Cholesterol: 177 mg/dL (ref 0–200)
HDL: 44.8 mg/dL (ref >39.00)
NONHDL: 132.44
TRIGLYCERIDES: 264 mg/dL — AB (ref 0.0–149.0)
VLDL: 52.8 mg/dL — AB (ref 0.0–40.0)

## 2016-04-16 LAB — URINALYSIS, ROUTINE W REFLEX MICROSCOPIC
BILIRUBIN URINE: NEGATIVE
Hgb urine dipstick: NEGATIVE
Ketones, ur: NEGATIVE
Leukocytes, UA: NEGATIVE
Nitrite: NEGATIVE
PH: 6 (ref 5.0–8.0)
Specific Gravity, Urine: 1.02 (ref 1.000–1.030)
Total Protein, Urine: NEGATIVE
Urine Glucose: NEGATIVE
Urobilinogen, UA: 0.2 (ref 0.0–1.0)

## 2016-04-16 LAB — HEPATIC FUNCTION PANEL
ALT: 17 U/L (ref 0–53)
AST: 17 U/L (ref 0–37)
Albumin: 4.4 g/dL (ref 3.5–5.2)
Alkaline Phosphatase: 75 U/L (ref 39–117)
BILIRUBIN TOTAL: 0.7 mg/dL (ref 0.2–1.2)
Bilirubin, Direct: 0.1 mg/dL (ref 0.0–0.3)
Total Protein: 7.1 g/dL (ref 6.0–8.3)

## 2016-04-16 LAB — HEMOGLOBIN A1C: Hgb A1c MFr Bld: 4.8 % (ref 4.6–6.5)

## 2016-04-16 LAB — LDL CHOLESTEROL, DIRECT: LDL DIRECT: 104 mg/dL

## 2016-04-16 LAB — TSH: TSH: 1.55 u[IU]/mL (ref 0.35–4.50)

## 2016-04-16 LAB — HIV ANTIBODY (ROUTINE TESTING W REFLEX): HIV 1&2 Ab, 4th Generation: NONREACTIVE

## 2016-04-16 LAB — PSA: PSA: 0.99 ng/mL (ref 0.10–4.00)

## 2016-04-16 NOTE — Progress Notes (Signed)
Subjective:    Patient ID: Micheal Lawson, male    DOB: [birth year], 50 y.o.   MRN: [id number]  HPI Pt is here for regular wellness examination, and is feeling pretty well in general, and says chronic med probs are stable, except as noted below.   Past Medical History  Diagnosis Date  . ALLERGIC RHINITIS 05/08/2007    Qualifier: Diagnosis of  By: Marca Ancona RMA, [name]    . ELEVATED BLOOD PRESSURE WITHOUT DIAGNOSIS OF HYPERTENSION 08/31/2008    Qualifier: Diagnosis of  By: Jenny Reichmann MD, Hunt Oris   . GLAUCOMA 04/01/2009    Qualifier: Diagnosis of  By: Loanne Drilling MD, Jacelyn Pi     Past Surgical History  Procedure Laterality Date  . Electrocardiogram  04/04/2007  . Cyst removal neck    . Shoulder arthroscopy with rotator cuff repair and subacromial decompression  09/26/2012    Procedure: SHOULDER ARTHROSCOPY WITH ROTATOR CUFF REPAIR AND SUBACROMIAL DECOMPRESSION;  Surgeon: Yvette Rack., MD;  Location: [facility];  Service: Orthopedics;  Laterality: Right;  RIGHT SHOULDER ARTHROSCOPY WITH EXTENSIVE DEBRIDEMENT, SUBACROMIAL DECOMPRESSION, PARTIAL ACROMIOPLASTY WITH CORACROMIAL RELEASE    Social History   Social History  . Marital Status: Married    Spouse Name: N/A  . Number of Children: N/A  . Years of Education: N/A   Occupational History  . 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 Smoking status: Never Smoker   . Smokeless tobacco: Not on file  . Alcohol Use: 0.0 oz/week    0 Standard drinks or equivalent per week     Comment: occ  . Drug Use: Not on file  . Sexual Activity: Not on file   Other Topics Concern  . Not on file   Social History Narrative    Current Outpatient Prescriptions on File Prior to Visit  Medication Sig Dispense Refill  . aspirin EC 81 MG tablet Take 81 mg by mouth daily.    . cetirizine (ZYRTEC) 10 MG tablet Take 10 mg by mouth daily.    . dorzolamide-timolol (COSOPT) 22.3-6.8 MG/ML ophthalmic solution Place 1 drop into both eyes 2 (two)  times daily.    Marland Kitchen ibuprofen (ADVIL,MOTRIN) 200 MG tablet Take 400-600 mg by mouth every 6 (six) hours as needed. As needed for pain.    Marland Kitchen omeprazole (PRILOSEC) 40 MG capsule Take 1 capsule (40 mg total) by mouth daily. 90 capsule 3  . Travoprost, BAK Free, (TRAVATAN) 0.004 % SOLN ophthalmic solution Place 1 drop into both eyes daily.    . traZODone (DESYREL) 50 MG tablet Take 1 tablet (50 mg total) by mouth at bedtime. 30 tablet 11   No current facility-administered medications on file prior to visit.    No Known Allergies  Family History  Problem Relation Age of Onset  . Heart disease Father     CABG 42  . Leukemia Sister     BP 138/88 mmHg  Pulse 53  Ht 6\' 1"  (1.854 m)  Wt 192 lb (87.091 kg)  BMI 25.34 kg/m2  SpO2 97%  Review of Systems  Constitutional: Negative for fever and unexpected weight change.  HENT: Negative for hearing loss.   Eyes: Negative for visual disturbance.  Respiratory: Negative for shortness of breath.   Cardiovascular: Negative for chest pain.  Gastrointestinal: Negative for blood in stool.  Endocrine: Negative for cold intolerance.  Genitourinary: Negative for hematuria and difficulty urinating.  Musculoskeletal: Negative for back pain.  Skin: Negative for  rash.  Allergic/Immunologic: Positive for environmental allergies.  Neurological: Negative for numbness.  Hematological: Does not bruise/bleed easily.  Psychiatric/Behavioral: Negative for sleep disturbance.      Objective:   Physical Exam VS: see vs page GEN: no distress HEAD: head: no deformity eyes: no periorbital swelling, no proptosis external nose and ears are normal mouth: no lesion seen NECK: supple, thyroid is not enlarged.   CHEST WALL: no deformity LUNGS: clear to auscultation BREASTS:  No gynecomastia CV: reg rate and rhythm, no murmur ABD: abdomen is soft, nontender.  no hepatosplenomegaly.  not distended.  no hernia.   PROSTATE:  Normal size.  No nodule MUSCULOSKELETAL:  muscle bulk and strength are grossly normal.  no obvious joint swelling.  gait is normal and steady EXTEMITIES: no deformity.  no ulcer on the feet.  feet are of normal color and temp.  no edema PULSES: dorsalis pedis intact bilat.  no carotid bruit NEURO:  cn 2-12 grossly intact.   readily moves all 4's.  sensation is intact to touch on the feet SKIN:  Normal texture and temperature.  No rash or suspicious lesion is visible.   NODES:  None palpable at the neck.   PSYCH: alert, well-oriented.  Does not appear anxious nor depressed.    i personally reviewed electrocardiogram tracing (today): Indication: wellness Impression: normal    Assessment & Plan:  Wellness visit today, with problems stable, except as noted.    Patient is advised the following: Patient Instructions  Please consider these measures for your health:  minimize alcohol.  Do not use tobacco products.  Have a colonoscopy at least every 10 years from age 68.  Women should have an annual mammogram from age 52.  Keep firearms safely stored.  Always use seat belts.  have working smoke alarms in your home.  See an eye doctor and dentist regularly.  Never drive under the influence of alcohol or drugs (including prescription drugs).  Those with fair skin should take precautions against the sun, and should carefully examine their skin once per month, for any new or changed moles. blood tests are requested for you today.  We'll let you know about the results.   Please return for your next annual checkup in in 1 year.    Renato Shin, MD

## 2016-04-16 NOTE — Patient Instructions (Addendum)
Please consider these measures for your health:  minimize alcohol.  Do not use tobacco products.  Have a colonoscopy at least every 10 years from age 50.  Women should have an annual mammogram from age 18.  Keep firearms safely stored.  Always use seat belts.  have working smoke alarms in your home.  See an eye doctor and dentist regularly.  Never drive under the influence of alcohol or drugs (including prescription drugs).  Those with fair skin should take precautions against the sun, and should carefully examine their skin once per month, for any new or changed moles. blood tests are requested for you today.  We'll let you know about the results.   Please return for your next annual checkup in in 1 year.

## 2016-04-17 ENCOUNTER — Encounter: Payer: Self-pay | Admitting: Gastroenterology

## 2016-05-09 DIAGNOSIS — D235 Other benign neoplasm of skin of trunk: Secondary | ICD-10-CM | POA: Diagnosis not present

## 2016-05-09 DIAGNOSIS — L814 Other melanin hyperpigmentation: Secondary | ICD-10-CM | POA: Diagnosis not present

## 2016-05-18 ENCOUNTER — Ambulatory Visit (AMBULATORY_SURGERY_CENTER): Payer: Self-pay

## 2016-05-18 VITALS — Ht 73.0 in | Wt 190.4 lb

## 2016-05-18 DIAGNOSIS — Z800 Family history of malignant neoplasm of digestive organs: Secondary | ICD-10-CM

## 2016-05-18 MED ORDER — SUPREP BOWEL PREP KIT 17.5-3.13-1.6 GM/177ML PO SOLN: 1.0000 | Freq: Once | ORAL | 0 refills | Status: AC

## 2016-05-18 NOTE — Progress Notes (Signed)
No allergies to eggs or soy No past problems with anesthesia No home oxygen No diet meds  Has email and internet; declined emmi 

## 2016-05-21 DIAGNOSIS — H401122 Primary open-angle glaucoma, left eye, moderate stage: Secondary | ICD-10-CM | POA: Diagnosis not present

## 2016-05-21 DIAGNOSIS — H5213 Myopia, bilateral: Secondary | ICD-10-CM | POA: Diagnosis not present

## 2016-05-21 DIAGNOSIS — Z135 Encounter for screening for eye and ear disorders: Secondary | ICD-10-CM | POA: Diagnosis not present

## 2016-05-21 DIAGNOSIS — H524 Presbyopia: Secondary | ICD-10-CM | POA: Diagnosis not present

## 2016-05-21 DIAGNOSIS — H401111 Primary open-angle glaucoma, right eye, mild stage: Secondary | ICD-10-CM | POA: Diagnosis not present

## 2016-06-08 ENCOUNTER — Encounter: Payer: Self-pay | Admitting: Gastroenterology

## 2016-06-08 ENCOUNTER — Ambulatory Visit (AMBULATORY_SURGERY_CENTER): Payer: 59 | Admitting: Gastroenterology

## 2016-06-08 VITALS — BP 120/84 | HR 63 | Temp 98.6°F | Resp 12 | Ht 73.0 in | Wt 190.0 lb

## 2016-06-08 DIAGNOSIS — Z1211 Encounter for screening for malignant neoplasm of colon: Secondary | ICD-10-CM

## 2016-06-08 DIAGNOSIS — K621 Rectal polyp: Secondary | ICD-10-CM | POA: Diagnosis not present

## 2016-06-08 DIAGNOSIS — D129 Benign neoplasm of anus and anal canal: Secondary | ICD-10-CM

## 2016-06-08 DIAGNOSIS — D128 Benign neoplasm of rectum: Secondary | ICD-10-CM

## 2016-06-08 DIAGNOSIS — Z800 Family history of malignant neoplasm of digestive organs: Secondary | ICD-10-CM

## 2016-06-08 MED ORDER — SODIUM CHLORIDE 0.9 % IV SOLN: 500.0000 mL | INTRAVENOUS | Status: DC

## 2016-06-08 NOTE — Patient Instructions (Signed)
Colon polyp x 1 removed today. Handout given on polyps. Result letter in your mail in 2-3 weeks. Resume current medications. Call us with any questions or concerns. Thank you!  YOU HAD AN ENDOSCOPIC PROCEDURE TODAY AT [facility]:   Refer to the procedure report that was given to you for any specific questions about what was found during the examination.  If the procedure report does not answer your questions, please call your gastroenterologist to clarify.  If you requested that your care partner not be given the details of your procedure findings, then the procedure report has been included in a sealed envelope for you to review at your convenience later.  YOU SHOULD EXPECT: Some feelings of bloating in the abdomen. Passage of more gas than usual.  Walking can help get rid of the air that was put into your GI tract during the procedure and reduce the bloating. If you had a lower endoscopy (such as a colonoscopy or flexible sigmoidoscopy) you may notice spotting of blood in your stool or on the toilet paper. If you underwent a bowel prep for your procedure, you may not have a normal bowel movement for a few days.  Please Note:  You might notice some irritation and congestion in your nose or some drainage.  This is from the oxygen used during your procedure.  There is no need for concern and it should clear up in a day or so.  SYMPTOMS TO REPORT IMMEDIATELY:   Following lower endoscopy (colonoscopy or flexible sigmoidoscopy):  Excessive amounts of blood in the stool  Significant tenderness or worsening of abdominal pains  Swelling of the abdomen that is new, acute  Fever of 100F or higher   For urgent or emergent issues, a gastroenterologist can be reached at any hour by calling [phone].   DIET:  We do recommend a small meal at first, but then you may proceed to your regular diet.  Drink plenty of fluids but you should avoid alcoholic beverages for 24  hours.  ACTIVITY:  You should plan to take it easy for the rest of today and you should NOT DRIVE or use heavy machinery until tomorrow (because of the sedation medicines used during the test).    FOLLOW UP: Our staff will call the number listed on your records the next business day following your procedure to check on you and address any questions or concerns that you may have regarding the information given to you following your procedure. If we do not reach you, we will leave a message.  However, if you are feeling well and you are not experiencing any problems, there is no need to return our call.  We will assume that you have returned to your regular daily activities without incident.  If any biopsies were taken you will be contacted by phone or by letter within the next 1-3 weeks.  Please call us at [phone] if you have not heard about the biopsies in 3 weeks.    SIGNATURES/CONFIDENTIALITY: You and/or your care partner have signed paperwork which will be entered into your electronic medical record.  These signatures attest to the fact that that the information above on your After Visit Summary has been reviewed and is understood.  Full responsibility of the confidentiality of this discharge information lies with you and/or your care-partner.

## 2016-06-08 NOTE — Op Note (Signed)
[facility] Patient Name: Micheal Lawson Procedure Date: 06/08/2016 11:00 AM MRN: [id number] Endoscopist: Milus Banister , MD Age: 50 Referring MD:  Date of Birth: 11-Nov-1965 Gender: Male Account #: [id number] Procedure:                Colonoscopy Indications:              Screening for colorectal malignant neoplasm Medicines:                Monitored Anesthesia Care Procedure:                Pre-Anesthesia Assessment:                           - Prior to the procedure, a History and Physical                            was performed, and patient medications and                            allergies were reviewed. The patient's tolerance of                            previous anesthesia was also reviewed. The risks                            and benefits of the procedure and the sedation                            options and risks were discussed with the patient.                            All questions were answered, and informed consent                            was obtained. Prior Anticoagulants: The patient has                            taken no previous anticoagulant or antiplatelet                            agents. ASA Grade Assessment: II - A patient with                            mild systemic disease. After reviewing the risks                            and benefits, the patient was deemed in                            satisfactory condition to undergo the procedure.                           After obtaining informed consent, the colonoscope  was passed under direct vision. Throughout the                            procedure, the patient's blood pressure, pulse, and                            oxygen saturations were monitored continuously. The                            Model PCF-[id number]) scope was introduced                            through the anus and advanced to the the cecum,                            identified by  appendiceal orifice and ileocecal                            valve. The colonoscopy was performed without                            difficulty. The patient tolerated the procedure                            well. The quality of the bowel preparation was                            excellent. The ileocecal valve, appendiceal                            orifice, and rectum were photographed. Scope In: 11:08:58 AM Scope Out: 11:21:58 AM Scope Withdrawal Time: 0 hours 10 minutes 37 seconds  Total Procedure Duration: 0 hours 13 minutes 0 seconds  Findings:                 A 4 mm polyp was found in the rectum. The polyp was                            sessile. The polyp was removed with a cold snare.                            Resection and retrieval were complete.                           The exam was otherwise without abnormality on                            direct and retroflexion views. Complications:            No immediate complications. Estimated blood loss:                            None. Estimated Blood Loss:     Estimated blood loss: none. Impression:               -  One 4 mm polyp in the rectum, removed with a cold                            snare. Resected and retrieved.                           - The examination was otherwise normal on direct                            and retroflexion views. Recommendation:           - Patient has a contact number available for                            emergencies. The signs and symptoms of potential                            delayed complications were discussed with the                            patient. Return to normal activities tomorrow.                            Written discharge instructions were provided to the                            patient.                           - Resume previous diet.                           - Continue present medications.                           You will receive a letter within 2-3 weeks with the                             pathology results and my final recommendations.                           If the polyp(s) is proven to be 'pre-cancerous' on                            pathology, you will need repeat colonoscopy in 5                            years. If the polyp(s) is NOT 'precancerous' on                            pathology then you should repeat colon cancer                            screening in 10 years with colonoscopy without need  for colon cancer screening by any method prior to                            then (including stool testing). Milus Banister, MD 06/08/2016 11:23:58 AM This report has been signed electronically.

## 2016-06-08 NOTE — Progress Notes (Signed)
Called to room to assist during endoscopic procedure.  Patient ID and intended procedure confirmed with present staff. Received instructions for my participation in the procedure from the performing physician.  

## 2016-06-08 NOTE — Progress Notes (Signed)
A/ox3 pleased with MAC, report to Robbin RN 

## 2016-06-12 ENCOUNTER — Telehealth: Payer: Self-pay

## 2016-06-12 NOTE — Telephone Encounter (Signed)
No answer, left voicemail message.

## 2016-06-15 ENCOUNTER — Encounter: Payer: Self-pay | Admitting: Gastroenterology

## 2016-08-13 DIAGNOSIS — M9905 Segmental and somatic dysfunction of pelvic region: Secondary | ICD-10-CM | POA: Diagnosis not present

## 2016-08-13 DIAGNOSIS — M9902 Segmental and somatic dysfunction of thoracic region: Secondary | ICD-10-CM | POA: Diagnosis not present

## 2016-08-13 DIAGNOSIS — M791 Myalgia: Secondary | ICD-10-CM | POA: Diagnosis not present

## 2016-08-13 DIAGNOSIS — M9903 Segmental and somatic dysfunction of lumbar region: Secondary | ICD-10-CM | POA: Diagnosis not present

## 2016-08-21 DIAGNOSIS — M9905 Segmental and somatic dysfunction of pelvic region: Secondary | ICD-10-CM | POA: Diagnosis not present

## 2016-08-21 DIAGNOSIS — M9903 Segmental and somatic dysfunction of lumbar region: Secondary | ICD-10-CM | POA: Diagnosis not present

## 2016-08-21 DIAGNOSIS — M791 Myalgia: Secondary | ICD-10-CM | POA: Diagnosis not present

## 2016-08-21 DIAGNOSIS — M9902 Segmental and somatic dysfunction of thoracic region: Secondary | ICD-10-CM | POA: Diagnosis not present

## 2016-11-07 DIAGNOSIS — L814 Other melanin hyperpigmentation: Secondary | ICD-10-CM | POA: Diagnosis not present

## 2016-11-07 DIAGNOSIS — L821 Other seborrheic keratosis: Secondary | ICD-10-CM | POA: Diagnosis not present

## 2016-11-07 DIAGNOSIS — L820 Inflamed seborrheic keratosis: Secondary | ICD-10-CM | POA: Diagnosis not present

## 2016-11-07 DIAGNOSIS — D225 Melanocytic nevi of trunk: Secondary | ICD-10-CM | POA: Diagnosis not present

## 2016-11-26 ENCOUNTER — Other Ambulatory Visit: Payer: Self-pay | Admitting: Endocrinology

## 2017-01-14 ENCOUNTER — Other Ambulatory Visit: Payer: Self-pay | Admitting: Endocrinology

## 2017-03-30 ENCOUNTER — Emergency Department (HOSPITAL_COMMUNITY): Payer: No Typology Code available for payment source

## 2017-03-30 ENCOUNTER — Encounter (HOSPITAL_COMMUNITY): Payer: Self-pay | Admitting: Emergency Medicine

## 2017-03-30 ENCOUNTER — Emergency Department (HOSPITAL_COMMUNITY)
Admission: EM | Admit: 2017-03-30 | Discharge: 2017-03-30 | Disposition: A | Discharge status: Home / Self Care | Payer: No Typology Code available for payment source | Attending: Emergency Medicine | Admitting: Emergency Medicine

## 2017-03-30 DIAGNOSIS — Z23 Encounter for immunization: Secondary | ICD-10-CM | POA: Insufficient documentation

## 2017-03-30 DIAGNOSIS — Z7982 Long term (current) use of aspirin: Secondary | ICD-10-CM | POA: Insufficient documentation

## 2017-03-30 DIAGNOSIS — Z79899 Other long term (current) drug therapy: Secondary | ICD-10-CM | POA: Diagnosis not present

## 2017-03-30 DIAGNOSIS — S61511A Laceration without foreign body of right wrist, initial encounter: Secondary | ICD-10-CM | POA: Diagnosis not present

## 2017-03-30 DIAGNOSIS — Y999 Unspecified external cause status: Secondary | ICD-10-CM | POA: Diagnosis not present

## 2017-03-30 DIAGNOSIS — S60221A Contusion of right hand, initial encounter: Secondary | ICD-10-CM | POA: Diagnosis not present

## 2017-03-30 DIAGNOSIS — Y9389 Activity, other specified: Secondary | ICD-10-CM | POA: Insufficient documentation

## 2017-03-30 DIAGNOSIS — G8911 Acute pain due to trauma: Secondary | ICD-10-CM | POA: Diagnosis not present

## 2017-03-30 DIAGNOSIS — S6991XA Unspecified injury of right wrist, hand and finger(s), initial encounter: Secondary | ICD-10-CM | POA: Diagnosis not present

## 2017-03-30 DIAGNOSIS — M79641 Pain in right hand: Secondary | ICD-10-CM | POA: Diagnosis not present

## 2017-03-30 DIAGNOSIS — S6990XA Unspecified injury of unspecified wrist, hand and finger(s), initial encounter: Secondary | ICD-10-CM | POA: Diagnosis not present

## 2017-03-30 DIAGNOSIS — S61512A Laceration without foreign body of left wrist, initial encounter: Secondary | ICD-10-CM | POA: Diagnosis not present

## 2017-03-30 DIAGNOSIS — Y92410 Unspecified street and highway as the place of occurrence of the external cause: Secondary | ICD-10-CM | POA: Insufficient documentation

## 2017-03-30 MED ORDER — LIDOCAINE HCL (PF) 1 % IJ SOLN
5.0000 mL | Freq: Once | INTRAMUSCULAR | Status: AC
  Administered 2017-03-30: 5 mL
  Filled 2017-03-30: qty 30

## 2017-03-30 MED ORDER — TETANUS-DIPHTH-ACELL PERTUSSIS 5-2.5-18.5 LF-MCG/0.5 IM SUSP
0.5000 mL | Freq: Once | INTRAMUSCULAR | Status: AC
  Administered 2017-03-30: 0.5 mL via INTRAMUSCULAR
  Filled 2017-03-30: qty 0.5
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
## 2017-03-30 NOTE — ED Notes (Signed)
Irrigated patient's wound with saline
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
## 2017-03-30 NOTE — ED Notes (Signed)
Pt ambulatory and independent at discharge.  Verbalized understanding of discharge instructions 

## 2017-03-30 NOTE — Discharge Instructions (Signed)
Read the information below.  You may return to the Emergency Department at any time for worsening condition or any new symptoms that concern you.  If you develop redness, swelling, pus draining from the wound, or fevers greater than 100.4, return to the ER immediately for a recheck.   °

## 2017-04-16 ENCOUNTER — Ambulatory Visit: Payer: 59 | Admitting: Endocrinology

## 2017-04-30 ENCOUNTER — Encounter: Payer: Self-pay | Admitting: Endocrinology

## 2017-04-30 ENCOUNTER — Ambulatory Visit (INDEPENDENT_AMBULATORY_CARE_PROVIDER_SITE_OTHER): Payer: 59 | Admitting: Endocrinology

## 2017-04-30 VITALS — BP 122/80 | HR 54 | Wt 195.4 lb

## 2017-04-30 DIAGNOSIS — Z0000 Encounter for general adult medical examination without abnormal findings: Secondary | ICD-10-CM

## 2017-04-30 LAB — CBC WITH DIFFERENTIAL/PLATELET
BASOS ABS: 0 10*3/uL (ref 0.0–0.1)
Basophils Relative: 0.2 % (ref 0.0–3.0)
EOS PCT: 4.4 % (ref 0.0–5.0)
Eosinophils Absolute: 0.2 10*3/uL (ref 0.0–0.7)
HEMATOCRIT: 46.1 % (ref 39.0–52.0)
Hemoglobin: 15.8 g/dL (ref 13.0–17.0)
LYMPHS ABS: 2.1 10*3/uL (ref 0.7–4.0)
LYMPHS PCT: 47.2 % — AB (ref 12.0–46.0)
MCHC: 34.3 g/dL (ref 30.0–36.0)
MCV: 89.9 fl (ref 78.0–100.0)
MONOS PCT: 12 % (ref 3.0–12.0)
Monocytes Absolute: 0.5 10*3/uL (ref 0.1–1.0)
NEUTROS ABS: 1.6 10*3/uL (ref 1.4–7.7)
Neutrophils Relative %: 36.2 % — ABNORMAL LOW (ref 43.0–77.0)
PLATELETS: 275 10*3/uL (ref 150.0–400.0)
RBC: 5.13 Mil/uL (ref 4.22–5.81)
RDW: 12.4 % (ref 11.5–15.5)
WBC: 4.4 10*3/uL (ref 4.0–10.5)

## 2017-04-30 LAB — HEPATIC FUNCTION PANEL
ALK PHOS: 72 U/L (ref 39–117)
ALT: 27 U/L (ref 0–53)
AST: 17 U/L (ref 0–37)
Albumin: 4.3 g/dL (ref 3.5–5.2)
BILIRUBIN DIRECT: 0.1 mg/dL (ref 0.0–0.3)
TOTAL PROTEIN: 6.7 g/dL (ref 6.0–8.3)
Total Bilirubin: 0.5 mg/dL (ref 0.2–1.2)

## 2017-04-30 LAB — PSA: PSA: 1.01 ng/mL (ref 0.10–4.00)

## 2017-04-30 LAB — BASIC METABOLIC PANEL
BUN: 16 mg/dL (ref 6–23)
CHLORIDE: 104 meq/L (ref 96–112)
CO2: 30 mEq/L (ref 19–32)
CREATININE: 0.99 mg/dL (ref 0.40–1.50)
Calcium: 9.4 mg/dL (ref 8.4–10.5)
GFR: 84.64 mL/min (ref >60.00)
Glucose, Bld: 99 mg/dL (ref 70–99)
POTASSIUM: 4.3 meq/L (ref 3.5–5.1)
Sodium: 138 mEq/L (ref 135–145)

## 2017-04-30 LAB — LIPID PANEL
CHOL/HDL RATIO: 4
CHOLESTEROL: 170 mg/dL (ref 0–200)
HDL: 47.2 mg/dL (ref >39.00)
NonHDL: 122.52
TRIGLYCERIDES: 238 mg/dL — AB (ref 0.0–149.0)
VLDL: 47.6 mg/dL — ABNORMAL HIGH (ref 0.0–40.0)

## 2017-04-30 LAB — LDL CHOLESTEROL, DIRECT: Direct LDL: 100 mg/dL

## 2017-04-30 LAB — TSH: TSH: 1.49 u[IU]/mL (ref 0.35–4.50)

## 2017-04-30 LAB — HEMOGLOBIN A1C: Hgb A1c MFr Bld: 5.1 % (ref 4.6–6.5)

## 2017-04-30 NOTE — Patient Instructions (Signed)
good diet and exercise significantly improve the control of your diabetes.  please let me know if you wish to be referred to a dietician.  high blood sugar is very risky to your health.  you should see an eye doctor and dentist every year.  It is very important to get all recommended vaccinations.  Controlling your blood pressure and cholesterol drastically reduces the damage diabetes does to your body.  Those who smoke should quit.  Please discuss these with your doctor.  blood tests are requested for you today.  We'll let you know about the results. Please return in 1 year.

## 2017-04-30 NOTE — Progress Notes (Signed)
Subjective:    Patient ID: Micheal Lawson, male    DOB: 04/20/1966, 51 y.o.   MRN: [id number]  HPI Pt is here for regular wellness examination, and is feeling pretty well in general, and says chronic med probs are stable, except as noted below Past Medical History:  Diagnosis Date  . ALLERGIC RHINITIS 05/08/2007   Qualifier: Diagnosis of  By: Marca Ancona RMA, [name]    . ELEVATED BLOOD PRESSURE WITHOUT DIAGNOSIS OF HYPERTENSION 08/31/2008   Qualifier: Diagnosis of  By: Jenny Reichmann MD, Hunt Oris   . GLAUCOMA 04/01/2009   Qualifier: Diagnosis of  By: Loanne Drilling MD, Jacelyn Pi     Past Surgical History:  Procedure Laterality Date  . CYST REMOVAL NECK    . ELECTROCARDIOGRAM  04/04/2007  . SHOULDER ARTHROSCOPY WITH ROTATOR CUFF REPAIR AND SUBACROMIAL DECOMPRESSION  09/26/2012   Procedure: SHOULDER ARTHROSCOPY WITH ROTATOR CUFF REPAIR AND SUBACROMIAL DECOMPRESSION;  Surgeon: Yvette Rack., MD;  Location: [facility];  Service: Orthopedics;  Laterality: Right;  RIGHT SHOULDER ARTHROSCOPY WITH EXTENSIVE DEBRIDEMENT, SUBACROMIAL DECOMPRESSION, PARTIAL ACROMIOPLASTY WITH CORACROMIAL RELEASE    Social History   Social History  . Marital status: Married    Spouse name: N/A  . Number of children: N/A  . Years of education: N/A   Occupational History  . 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 Smoking status: Never Smoker  . Smokeless tobacco: Never Used  . Alcohol use 1.2 oz/week    2 Cans of beer per week     Comment: occ  . Drug use: No  . Sexual activity: Not on file   Other Topics Concern  . Not on file   Social History Narrative  . No narrative on file    Current Outpatient Prescriptions on File Prior to Visit  Medication Sig Dispense Refill  . aspirin EC 81 MG tablet Take 81 mg by mouth daily.    . cetirizine (ZYRTEC) 10 MG tablet Take 10 mg by mouth daily.    . dorzolamide-timolol (COSOPT) 22.3-6.8 MG/ML ophthalmic solution Place 1 drop into both eyes 2 (two) times  daily.    Marland Kitchen ibuprofen (ADVIL,MOTRIN) 200 MG tablet Take 400-600 mg by mouth every 6 (six) hours as needed. As needed for pain.    . Multiple Vitamin (MULTIVITAMIN) tablet Take 1 tablet by mouth daily.    Marland Kitchen omeprazole (PRILOSEC) 40 MG capsule TAKE 1 CAPSULE BY MOUTH ONCE DAILY 90 capsule 2  . Travoprost, BAK Free, (TRAVATAN) 0.004 % SOLN ophthalmic solution Place 1 drop into both eyes daily.    . traZODone (DESYREL) 50 MG tablet TAKE 1 TABLET BY MOUTH AT BEDTIME 30 tablet 9   Current Facility-Administered Medications on File Prior to Visit  Medication Dose Route Frequency Provider Last Rate Last Dose  . 0.9 %  sodium chloride infusion  500 mL Intravenous Continuous Milus Banister, MD        No Known Allergies  Family History  Problem Relation Age of Onset  . Leukemia Sister   . Heart disease Father        CABG 63  . Colon cancer Maternal Aunt     BP 122/80   Pulse (!) 54   Wt 195 lb 6.4 oz (88.6 kg)   SpO2 98%   BMI 25.78 kg/m   Review of Systems  Constitutional: Negative for fever and unexpected weight change.  HENT: Negative for hearing loss.   Eyes: Negative for visual disturbance.  Respiratory: Negative for shortness of breath.   Cardiovascular: Negative for chest pain.  Gastrointestinal: Negative for abdominal pain and anal bleeding.  Endocrine: Negative for cold intolerance.  Genitourinary: Negative for difficulty urinating and hematuria.  Musculoskeletal: Negative for back pain.  Skin: Negative for rash.  Allergic/Immunologic:       Allergy sxs are well-controlled  Neurological: Negative for numbness.  Hematological: Does not bruise/bleed easily.  Psychiatric/Behavioral: Negative for dysphoric mood.       Objective:   Physical Exam VS: see vs page GEN: no distress HEAD: head: no deformity eyes: no periorbital swelling, no proptosis external nose and ears are normal mouth: no lesion seen NECK: supple, thyroid is not enlarged.  CHEST WALL: no deformity.    LUNGS: clear to auscultation BREASTS:  No gynecomastia CV: reg rate and rhythm, no murmur ABD: abdomen is soft, nontender.  no hepatosplenomegaly.  not distended.  no hernia.   RECTAL: normal external and internal exam.  Hemoccult is not done, as we are out of developer. PROSTATE:  Normal size.  No nodule MUSCULOSKELETAL: muscle bulk and strength are grossly normal.  no obvious joint swelling.  gait is normal and steady EXTEMITIES: no deformity.  no ulcer on the feet.  feet are of normal color and temp.  no edema.  There is bilateral onychomycosis of the toenails PULSES: dorsalis pedis intact bilat.  no carotid bruit NEURO:  cn 2-12 grossly intact.   readily moves all 4's.  sensation is intact to touch on the feet,   SKIN:  Normal texture and temperature.  No rash or suspicious lesion is visible.   NODES:  None palpable at the neck PSYCH: alert, well-oriented.  Does not appear anxious nor depressed.   I personally reviewed electrocardiogram tracing (today).   Indication: wellness Impression: SB.  No MI.  No hypertrophy. Compared to 2017: no significant change      Assessment & Plan:  Wellness visit today, with problems stable, except as noted.   Patient Instructions  good diet and exercise significantly improve the control of your diabetes.  please let me know if you wish to be referred to a dietician.  high blood sugar is very risky to your health.  you should see an eye doctor and dentist every year.  It is very important to get all recommended vaccinations.  Controlling your blood pressure and cholesterol drastically reduces the damage diabetes does to your body.  Those who smoke should quit.  Please discuss these with your doctor.  blood tests are requested for you today.  We'll let you know about the results. Please return in 1 year.

## 2017-06-12 DIAGNOSIS — T1502XA Foreign body in cornea, left eye, initial encounter: Secondary | ICD-10-CM | POA: Diagnosis not present

## 2017-06-26 DIAGNOSIS — M7662 Achilles tendinitis, left leg: Secondary | ICD-10-CM | POA: Diagnosis not present

## 2017-06-26 DIAGNOSIS — M79672 Pain in left foot: Secondary | ICD-10-CM | POA: Diagnosis not present

## 2017-07-01 DIAGNOSIS — M722 Plantar fascial fibromatosis: Secondary | ICD-10-CM | POA: Diagnosis not present

## 2017-07-17 DIAGNOSIS — M79672 Pain in left foot: Secondary | ICD-10-CM | POA: Diagnosis not present

## 2017-07-17 DIAGNOSIS — M7662 Achilles tendinitis, left leg: Secondary | ICD-10-CM | POA: Diagnosis not present

## 2017-07-24 ENCOUNTER — Other Ambulatory Visit: Payer: Self-pay | Admitting: Endocrinology

## 2017-10-09 ENCOUNTER — Ambulatory Visit (INDEPENDENT_AMBULATORY_CARE_PROVIDER_SITE_OTHER): Payer: Self-pay | Admitting: Emergency Medicine

## 2017-10-09 ENCOUNTER — Encounter: Payer: Self-pay | Admitting: Emergency Medicine

## 2017-10-09 VITALS — BP 142/100 | HR 60 | Temp 98.4°F | Resp 18 | Wt 191.8 lb

## 2017-10-09 DIAGNOSIS — J029 Acute pharyngitis, unspecified: Secondary | ICD-10-CM

## 2017-10-09 LAB — POCT RAPID STREP A (OFFICE): Rapid Strep A Screen: NEGATIVE

## 2017-10-09 NOTE — Patient Instructions (Signed)

## 2017-10-09 NOTE — Progress Notes (Signed)
  Subjective:     Micheal Lawson is a 52 y.o. male who presents for evaluation of sore throat. Associated symptoms include enlarged tonsils, sore throat and swollen glands. Onset of symptoms was 4 days ago, and have been stable since that time. He is drinking plenty of fluids. He has not had a recent close exposure to someone with proven streptococcal pharyngitis.  The following portions of the patient's history were reviewed and updated as appropriate: allergies and current medications.  Review of Systems Pertinent items are noted in HPI.    Objective:    BP (!) 142/100 (BP Location: Right Arm, Patient Position: Sitting, Cuff Size: Normal)   Pulse 60   Temp 98.4 F (36.9 C) (Oral)   Resp 18   Wt 191 lb 12.8 oz (87 kg)   SpO2 98%   BMI 25.30 kg/m  General appearance: alert, cooperative and appears stated age Head: Normocephalic, without obvious abnormality, atraumatic Eyes: negative Ears: normal TM's and external ear canals both ears Nose: Nares normal. Septum midline. Mucosa normal. No drainage or sinus tenderness. Throat: abnormal findings: Tonsils +3, no erythema or exudate Neck: mild anterior cervical adenopathy, no JVD and supple, symmetrical, trachea midline Lungs: clear to auscultation bilaterally Heart: regular rate and rhythm, S1, S2 normal, no murmur, click, rub or gallop Extremities: extremities normal, atraumatic, no cyanosis or edema Pulses: 2+ and symmetric Lymph nodes: Cervical adenopathy: Anterior  Laboratory Strep test done. Results:negative.    Assessment:    Acute pharyngitis, likely  Viral pharyngitis.    Plan:    Use of OTC analgesics recommended as well as salt water gargles. Follow up as needed. AVS printed

## 2017-10-11 ENCOUNTER — Telehealth: Payer: Self-pay

## 2017-10-25 ENCOUNTER — Encounter: Payer: Self-pay | Admitting: Endocrinology

## 2017-12-16 ENCOUNTER — Other Ambulatory Visit: Payer: Self-pay | Admitting: Endocrinology

## 2018-03-11 ENCOUNTER — Ambulatory Visit (INDEPENDENT_AMBULATORY_CARE_PROVIDER_SITE_OTHER): Payer: Self-pay | Admitting: Family Medicine

## 2018-03-11 VITALS — BP 118/78 | HR 69 | Temp 98.5°F | Resp 18 | Wt 187.2 lb

## 2018-03-11 DIAGNOSIS — L0390 Cellulitis, unspecified: Secondary | ICD-10-CM

## 2018-03-11 DIAGNOSIS — W57XXXA Bitten or stung by nonvenomous insect and other nonvenomous arthropods, initial encounter: Secondary | ICD-10-CM

## 2018-03-11 MED ORDER — DOXYCYCLINE HYCLATE 100 MG PO TABS: 100.0000 mg | ORAL_TABLET | Freq: Two times a day (BID) | ORAL | 0 refills | Status: AC

## 2018-03-11 NOTE — Progress Notes (Signed)
Micheal Lawson is a 52 y.o. male who presents today with concerns of tick bite that was about 3 days ago. He reports removing the tick entirely without difficulty but area continues to itch and then new onset localized swelling began in last 24 hours. Patient can bear weight and denies systemic symptoms of fever or malaise.  Review of Systems  Constitutional: Negative for chills, fever and malaise/fatigue.  HENT: Negative for congestion, ear discharge, ear pain, sinus pain and sore throat.   Eyes: Negative.   Respiratory: Negative for cough, sputum production and shortness of breath.   Cardiovascular: Negative.  Negative for chest pain.  Gastrointestinal: Negative for abdominal pain, diarrhea, nausea and vomiting.  Genitourinary: Negative for dysuria, frequency, hematuria and urgency.  Musculoskeletal: Negative for myalgias.  Skin: Positive for itching and rash.       Right lateral ankle tick bite 48 hours ago.  Neurological: Negative for headaches.  Endo/Heme/Allergies: Negative.   Psychiatric/Behavioral: Negative.     O: Vitals:   03/11/18 1237  BP: 118/78  Pulse: 69  Resp: 18  Temp: 98.5 F (36.9 C)  SpO2: 97%     Physical Exam  Constitutional: He is oriented to person, place, and time. Vital signs are normal. He appears well-developed and well-nourished. He is active.  Non-toxic appearance. He does not have a sickly appearance.  HENT:  Head: Normocephalic.  Right Ear: Hearing, external ear and ear canal normal.  Left Ear: Hearing, external ear and ear canal normal.  Nose: Nose normal.  Mouth/Throat: Uvula is midline and oropharynx is clear and moist.  Neck: Normal range of motion. Neck supple.  Cardiovascular: Normal rate, regular rhythm, normal heart sounds and normal pulses.  Pulmonary/Chest: Effort normal and breath sounds normal.  Abdominal: Soft. Bowel sounds are normal.  Musculoskeletal: Normal range of motion.  Lymphadenopathy:       Head (right side): No  submental and no submandibular adenopathy present.       Head (left side): No submental and no submandibular adenopathy present.    He has no cervical adenopathy.  Neurological: He is alert and oriented to person, place, and time.  Skin: Skin is intact. Rash noted. Rash is pustular.     2 cm x 2 cm total erythemic induration with 0.5 cm x 0.5 cm central round blister- entire area is firm to palpation but central lesion is fluid filled  Psychiatric: He has a normal mood and affect.  Vitals reviewed.    A: 1. Tick bite, initial encounter   2. Cellulitis, unspecified cellulitis site      P: Concern for Lyme vs. RMSF due to robust response to bite-pt denies long term tick attachment and thinks that it was less than 24 hours- is not able to identify tick but denies systemic symptoms of Ha, nausea, of body aches. Will treat for 21 days with standard dose and recommendation of Doxy 100 mg BID- discussed risk versus benefits with patient who agrees with POC. Supervision Dr. Benay Pillow MD Internal medicine consulted and directed care plan for this encounter. Area not lanced and marked with skin marker and patient advised to monitor and that induration should not spread beyond the area marked and if so to f/u in clinic. Exam findings, diagnosis etiology and medication use and indications reviewed with patient. Follow- Up and discharge instructions provided. No emergent/urgent issues found on exam.  Patient verbalized understanding of information provided and agrees with plan of care (POC), all questions answered.  1. Tick  bite, initial encounter  2. Cellulitis, unspecified cellulitis site  Other orders - doxycycline (VIBRA-TABS) 100 MG tablet; Take 1 tablet (100 mg total) by mouth 2 (two) times daily for 21 days.

## 2018-03-11 NOTE — Patient Instructions (Signed)
Cellulitis, Adult Cellulitis is a skin infection. The infected area is usually red and sore. This condition occurs most often in the arms and lower legs. It is very important to get treated for this condition. Follow these instructions at home:  Take over-the-counter and prescription medicines only as told by your doctor.  If you were prescribed an antibiotic medicine, take it as told by your doctor. Do not stop taking the antibiotic even if you start to feel better.  Drink enough fluid to keep your pee (urine) clear or pale yellow.  Do not touch or rub the infected area.  Raise (elevate) the infected area above the level of your heart while you are sitting or lying down.  Place warm or cold wet cloths (warm or cold compresses) on the infected area. Do this as told by your doctor.  Keep all follow-up visits as told by your doctor. This is important. These visits let your doctor make sure your infection is not getting worse. Contact a doctor if:  You have a fever.  Your symptoms do not get better after 1-2 days of treatment.  Your bone or joint under the infected area starts to hurt after the skin has healed.  Your infection comes back. This can happen in the same area or another area.  You have a swollen bump in the infected area.  You have new symptoms.  You feel ill and also have muscle aches and pains. Get help right away if:  Your symptoms get worse.  You feel very sleepy.  You throw up (vomit) or have watery poop (diarrhea) for a long time.  There are red streaks coming from the infected area.  Your red area gets larger.  Your red area turns darker. This information is not intended to replace advice given to you by your health care provider. Make sure you discuss any questions you have with your health care provider. Document Released: 03/12/2008 Document Revised: 03/01/2016 Document Reviewed: 08/03/2015 Elsevier Interactive Patient Education  2018 [place], Adult Ticks are insects that can bite. Most ticks live in shrubs and grassy areas. They climb onto people and animals that go by. Then they bite. Some ticks carry germs that can make you sick. How can I prevent tick bites?  Use an insect repellent that has 20% or higher of the ingredients DEET, picaridin, or IR3535. Put this insect repellent on: ? Bare skin. ? The tops of your boots. ? Your pant legs. ? The ends of your sleeves.  If you use an insect repellent that has the ingredient permethrin, make sure to follow the instructions on the bottle. Treat the following: ? Clothing. ? Supplies. ? Boots. ? Tents.  Wear long sleeves, long pants, and light colors.  Tuck your pant legs into your socks.  Stay in the middle of the trail.  Try not to walk through long grass.  Before going inside your house, check your clothes, hair, and skin for ticks. Make sure to check your head, neck, armpits, waist, groin, and joint areas.  Check for ticks every day.  When you come indoors: ? Wash your clothes right away. ? Shower right away. ? Dry your clothes in a dryer on high heat for 60 minutes or more. What is the right way to remove a tick? Remove a tick from your skin as soon as possible.  To remove a tick that is crawling on your skin: ? Go outdoors and brush the tick off. ?  Use tape or a lint roller.  To remove a tick that is biting: ? Wash your hands. ? If you have latex gloves, put them on. ? Use tweezers, curved forceps, or a tick-removal tool to grasp the tick. Grasp the tick as close to your skin and as close to the tick's head as possible. ? Gently pull up until the tick lets go.  Try to keep the tick's head attached to its body.  Do not twist or jerk the tick.  Do not squeeze or crush the tick.  Do not try to remove a tick with heat, alcohol, petroleum jelly, or fingernail polish. How should I get rid of a tick? Here are some ways to get rid  of a tick that is alive:  Place the tick in rubbing alcohol.  Place the tick in a bag or container you can close tightly.  Wrap the tick tightly in tape.  Flush the tick down the toilet.  Contact a doctor if:  You have symptoms of a disease, such as: ? Pain in a muscle, joint, or bone. ? Trouble walking or moving your legs. ? Numbness in your legs. ? Inability to move (paralysis). ? A red rash that makes a circle (bull's-eye rash). ? Redness and swelling where the tick bit you. ? A fever. ? Throwing up (vomiting) over and over. ? Diarrhea. ? Weight loss. ? Tender and swollen lymph glands. ? Shortness of breath. ? Cough. ? Belly pain (abdominal pain). ? Headache. ? Being more tired than normal. ? A change in how alert (conscious) you are. ? Confusion. Get help right away if:  You cannot remove a tick.  A part of a tick breaks off and gets stuck in your skin.  You are feeling worse. Summary  Ticks may carry germs that can make you sick.  To prevent tick bites, wear long sleeves, long pants, and light colors. Use insect repellent. Follow the instructions on the bottle.  If the tick is biting, do not try to remove it with heat, alcohol, petroleum jelly, or fingernail polish.  Use tweezers, curved forceps, or a tick-removal tool to grasp the tick. Gently pull up until the tick lets go. Do not twist or jerk the tick. Do not squeeze or crush the tick.  If you have symptoms, contact a doctor. This information is not intended to replace advice given to you by your health care provider. Make sure you discuss any questions you have with your health care provider. Document Released: 12/19/2009 Document Revised: 01/04/2017 Document Reviewed: 01/04/2017 Elsevier Interactive Patient Education  2018 [employer].

## 2018-04-04 ENCOUNTER — Other Ambulatory Visit: Payer: Self-pay

## 2018-04-04 ENCOUNTER — Encounter: Payer: Self-pay | Admitting: Endocrinology

## 2018-04-04 ENCOUNTER — Ambulatory Visit (INDEPENDENT_AMBULATORY_CARE_PROVIDER_SITE_OTHER): Payer: Self-pay | Admitting: Endocrinology

## 2018-04-04 VITALS — BP 128/82 | HR 64 | Wt 191.0 lb

## 2018-04-04 DIAGNOSIS — R42 Dizziness and giddiness: Secondary | ICD-10-CM | POA: Insufficient documentation

## 2018-04-04 DIAGNOSIS — Z0000 Encounter for general adult medical examination without abnormal findings: Secondary | ICD-10-CM

## 2018-04-04 NOTE — Progress Notes (Signed)
Subjective:    Patient ID: Micheal Lawson, male    DOB: 1966/06/23, 52 y.o.   MRN: [id number]  HPI Pt states 6 mod of intermitt dizziness sensation in the head, but no assoc LOC.  Pt is concerned about possible anginal equivalent.   Past Medical History:  Diagnosis Date  . ALLERGIC RHINITIS 05/08/2007   Qualifier: Diagnosis of  By: Marca Ancona RMA, [name]    . ELEVATED BLOOD PRESSURE WITHOUT DIAGNOSIS OF HYPERTENSION 08/31/2008   Qualifier: Diagnosis of  By: Jenny Reichmann MD, Hunt Oris   . GLAUCOMA 04/01/2009   Qualifier: Diagnosis of  By: Loanne Drilling MD, Jacelyn Pi     Past Surgical History:  Procedure Laterality Date  . CYST REMOVAL NECK    . ELECTROCARDIOGRAM  04/04/2007  . SHOULDER ARTHROSCOPY WITH ROTATOR CUFF REPAIR AND SUBACROMIAL DECOMPRESSION  09/26/2012   Procedure: SHOULDER ARTHROSCOPY WITH ROTATOR CUFF REPAIR AND SUBACROMIAL DECOMPRESSION;  Surgeon: Yvette Rack., MD;  Location: [facility];  Service: Orthopedics;  Laterality: Right;  RIGHT SHOULDER ARTHROSCOPY WITH EXTENSIVE DEBRIDEMENT, SUBACROMIAL DECOMPRESSION, PARTIAL ACROMIOPLASTY WITH CORACROMIAL RELEASE    Social History   Socioeconomic History  . Marital status: Married    Spouse name: Not on file  . Number of children: Not on file  . Years of education: Not on file  . Highest education level: Not on file  Occupational History  . 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 Financial resource strain: Not on file  . Food insecurity:    Worry: Not on file    Inability: Not on file  . Transportation needs:    Medical: Not on file    Non-medical: Not on file  Tobacco Use  . Smoking status: Never Smoker  . Smokeless tobacco: Never Used  Substance and Sexual Activity  . Alcohol use: Yes    Alcohol/week: 1.2 oz    Types: 2 Cans of beer per week    Comment: occ  . Drug use: No  . Sexual activity: Not on file  Lifestyle  . Physical activity:    Days per week: Not on file    Minutes per session:  Not on file  . Stress: Not on file  Relationships  . Social connections:    Talks on phone: Not on file    Gets together: Not on file    Attends religious service: Not on file    Active member of club or organization: Not on file    Attends meetings of clubs or organizations: Not on file    Relationship status: Not on file  . Intimate partner violence:    Fear of current or ex partner: Not on file    Emotionally abused: Not on file    Physically abused: Not on file    Forced sexual activity: Not on file  Other Topics Concern  . Not on file  Social History Narrative  . Not on file    Current Outpatient Medications on File Prior to Visit  Medication Sig Dispense Refill  . aspirin EC 81 MG tablet Take 81 mg by mouth daily.    . cetirizine (ZYRTEC) 10 MG tablet Take 10 mg by mouth daily.    . dorzolamide-timolol (COSOPT) 22.3-6.8 MG/ML ophthalmic solution Place 1 drop into both eyes 2 (two) times daily.    Marland Kitchen ibuprofen (ADVIL,MOTRIN) 200 MG tablet Take 400-600 mg by mouth every 6 (six) hours as needed. As needed for pain.    Marland Kitchen  Multiple Vitamin (MULTIVITAMIN) tablet Take 1 tablet by mouth daily.    Marland Kitchen omeprazole (PRILOSEC) 40 MG capsule TAKE 1 CAPSULE BY MOUTH ONCE DAILY 90 capsule 2  . Travoprost, BAK Free, (TRAVATAN) 0.004 % SOLN ophthalmic solution Place 1 drop into both eyes daily.    . traZODone (DESYREL) 50 MG tablet TAKE 1 TABLET BY MOUTH AT BEDTIME 30 tablet 9  . traZODone (DESYREL) 50 MG tablet TAKE 1 TABLET BY MOUTH AT BEDTIME 30 tablet 9   Current Facility-Administered Medications on File Prior to Visit  Medication Dose Route Frequency Provider Last Rate Last Dose  . 0.9 %  sodium chloride infusion  500 mL Intravenous Continuous Milus Banister, MD        No Known Allergies  Family History  Problem Relation Age of Onset  . Leukemia Sister   . Heart disease Father        CABG 28  . Colon cancer Maternal Aunt     BP 128/82 (BP Location: Left Arm, Patient Position:  Sitting, Cuff Size: Normal)   Pulse 64   Wt 191 lb (86.6 kg)   SpO2 96%   BMI 25.20 kg/m    Review of Systems Denies chest pain and sob    Objective:   Physical Exam VITAL SIGNS:  See vs page GENERAL: no distress LUNGS:  Clear to auscultation HEART:  Regular rate and rhythm without murmurs noted. Normal S1,S2.   Gait; normal and steady     Assessment & Plan:  Dizziness, new, uncertain etiology  Patient Instructions  Let's check a treadmill test.  you will receive a phone call, about a day and time for an appointment.   I'll see you next time, for your physical.

## 2018-04-04 NOTE — Patient Instructions (Signed)
Let's check a treadmill test.  you will receive a phone call, about a day and time for an appointment.   I'll see you next time, for your physical.

## 2018-04-16 ENCOUNTER — Encounter: Payer: Self-pay | Admitting: Endocrinology

## 2018-04-17 ENCOUNTER — Other Ambulatory Visit: Payer: Self-pay | Admitting: Endocrinology

## 2018-04-17 DIAGNOSIS — M549 Dorsalgia, unspecified: Secondary | ICD-10-CM | POA: Insufficient documentation

## 2018-04-17 DIAGNOSIS — G8929 Other chronic pain: Secondary | ICD-10-CM

## 2018-05-01 ENCOUNTER — Encounter: Payer: Self-pay | Admitting: Endocrinology

## 2018-05-02 ENCOUNTER — Encounter: Payer: Self-pay | Admitting: Endocrinology

## 2018-05-21 ENCOUNTER — Ambulatory Visit (INDEPENDENT_AMBULATORY_CARE_PROVIDER_SITE_OTHER): Payer: No Typology Code available for payment source

## 2018-05-21 ENCOUNTER — Encounter: Payer: Self-pay | Admitting: Endocrinology

## 2018-05-21 DIAGNOSIS — R42 Dizziness and giddiness: Secondary | ICD-10-CM | POA: Diagnosis not present

## 2018-05-22 ENCOUNTER — Other Ambulatory Visit: Payer: Self-pay | Admitting: Endocrinology

## 2018-05-22 DIAGNOSIS — L821 Other seborrheic keratosis: Secondary | ICD-10-CM

## 2018-05-22 LAB — EXERCISE TOLERANCE TEST
CHL CUP MPHR: 168 {beats}/min
CHL CUP RESTING HR STRESS: 73 {beats}/min
CSEPPHR: 148 {beats}/min
Estimated workload: 10.1 METS
Exercise duration (min): 9 min
Exercise duration (sec): 0 s
Percent HR: 88 %
RPE: 16

## 2018-05-29 ENCOUNTER — Ambulatory Visit (INDEPENDENT_AMBULATORY_CARE_PROVIDER_SITE_OTHER): Payer: No Typology Code available for payment source | Admitting: Endocrinology

## 2018-05-29 ENCOUNTER — Encounter: Payer: Self-pay | Admitting: Endocrinology

## 2018-05-29 VITALS — BP 122/98 | HR 62 | Ht 73.0 in | Wt 189.0 lb

## 2018-05-29 DIAGNOSIS — Z0000 Encounter for general adult medical examination without abnormal findings: Secondary | ICD-10-CM

## 2018-05-29 MED ORDER — LOSARTAN POTASSIUM 25 MG PO TABS: 25.0000 mg | ORAL_TABLET | Freq: Every day | ORAL | 3 refills | Status: DC

## 2018-05-29 NOTE — Patient Instructions (Addendum)
I have sent a prescription to your pharmacy, for the blood pressure.   Please consider these measures for your health:  minimize alcohol.  Do not use tobacco products.  Have a colonoscopy at least every 10 years from age 52.  Keep firearms safely stored.  Always use seat belts.  have working smoke alarms in your home.  See an eye doctor and dentist regularly.  Never drive under the influence of alcohol or drugs (including prescription drugs).  Those with fair skin should take precautions against the sun, and should carefully examine their skin once per month, for any new or changed moles.

## 2018-05-29 NOTE — Progress Notes (Signed)
Subjective:    Patient ID: Micheal Lawson, male    DOB: 11-May-1966, 52 y.o.   MRN: [id number]  HPI Pt is here for regular wellness examination, and is feeling pretty well in general, and says chronic med probs are stable, except as noted below Past Medical History:  Diagnosis Date  . ALLERGIC RHINITIS 05/08/2007   Qualifier: Diagnosis of  By: Marca Ancona RMA, [name]    . ELEVATED BLOOD PRESSURE WITHOUT DIAGNOSIS OF HYPERTENSION 08/31/2008   Qualifier: Diagnosis of  By: Jenny Reichmann MD, Hunt Oris   . GLAUCOMA 04/01/2009   Qualifier: Diagnosis of  By: Loanne Drilling MD, Jacelyn Pi     Past Surgical History:  Procedure Laterality Date  . CYST REMOVAL NECK    . ELECTROCARDIOGRAM  04/04/2007  . SHOULDER ARTHROSCOPY WITH ROTATOR CUFF REPAIR AND SUBACROMIAL DECOMPRESSION  09/26/2012   Procedure: SHOULDER ARTHROSCOPY WITH ROTATOR CUFF REPAIR AND SUBACROMIAL DECOMPRESSION;  Surgeon: Yvette Rack., MD;  Location: [facility];  Service: Orthopedics;  Laterality: Right;  RIGHT SHOULDER ARTHROSCOPY WITH EXTENSIVE DEBRIDEMENT, SUBACROMIAL DECOMPRESSION, PARTIAL ACROMIOPLASTY WITH CORACROMIAL RELEASE    Social History   Socioeconomic History  . Marital status: Married    Spouse name: Not on file  . Number of children: Not on file  . Years of education: Not on file  . Highest education level: Not on file  Occupational History  . 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 Financial resource strain: Not on file  . Food insecurity:    Worry: Not on file    Inability: Not on file  . Transportation needs:    Medical: Not on file    Non-medical: Not on file  Tobacco Use  . Smoking status: Never Smoker  . Smokeless tobacco: Never Used  Substance and Sexual Activity  . Alcohol use: Yes    Alcohol/week: 2.0 standard drinks    Types: 2 Cans of beer per week    Comment: occ  . Drug use: No  . Sexual activity: Not on file  Lifestyle  . Physical activity:    Days per week: Not on file   Minutes per session: Not on file  . Stress: Not on file  Relationships  . Social connections:    Talks on phone: Not on file    Gets together: Not on file    Attends religious service: Not on file    Active member of club or organization: Not on file    Attends meetings of clubs or organizations: Not on file    Relationship status: Not on file  . Intimate partner violence:    Fear of current or ex partner: Not on file    Emotionally abused: Not on file    Physically abused: Not on file    Forced sexual activity: Not on file  Other Topics Concern  . Not on file  Social History Narrative  . Not on file    Current Outpatient Medications on File Prior to Visit  Medication Sig Dispense Refill  . aspirin EC 81 MG tablet Take 81 mg by mouth daily.    . cetirizine (ZYRTEC) 10 MG tablet Take 10 mg by mouth daily.    . dorzolamide-timolol (COSOPT) 22.3-6.8 MG/ML ophthalmic solution Place 1 drop into both eyes 2 (two) times daily.    Marland Kitchen ibuprofen (ADVIL,MOTRIN) 200 MG tablet Take 400-600 mg by mouth every 6 (six) hours as needed. As needed for pain.    Marland Kitchen  Multiple Vitamin (MULTIVITAMIN) tablet Take 1 tablet by mouth daily.    Marland Kitchen omeprazole (PRILOSEC) 40 MG capsule TAKE 1 CAPSULE BY MOUTH ONCE DAILY 90 capsule 2  . Travoprost, BAK Free, (TRAVATAN) 0.004 % SOLN ophthalmic solution Place 1 drop into both eyes daily.    . traZODone (DESYREL) 50 MG tablet TAKE 1 TABLET BY MOUTH AT BEDTIME 30 tablet 9   Current Facility-Administered Medications on File Prior to Visit  Medication Dose Route Frequency Provider Last Rate Last Dose  . 0.9 %  sodium chloride infusion  500 mL Intravenous Continuous Milus Banister, MD        No Known Allergies  Family History  Problem Relation Age of Onset  . Leukemia Sister   . Heart disease Father        CABG 41  . Colon cancer Maternal Aunt     BP (!) 122/98   Pulse 62   Ht 6\' 1"  (1.854 m)   Wt 189 lb (85.7 kg)   SpO2 99%   BMI 24.94 kg/m      Review of Systems Denies fever, fatigue, visual loss, hearing loss, chest pain, sob, back pain, depression, cold intolerance, BRBPR, hematuria, syncope, numbness, allergy sxs, easy bruising, and rash.      Objective:   Physical Exam VS: see vs page GEN: no distress HEAD: head: no deformity eyes: no periorbital swelling, no proptosis external nose and ears are normal mouth: no lesion seen NECK: supple, thyroid is not enlarged.   CHEST WALL: no deformity.  LUNGS: clear to auscultation CV: reg rate and rhythm, no murmur ABD: abdomen is soft, nontender.  no hepatosplenomegaly.  not distended.  no hernia MUSCULOSKELETAL: muscle bulk and strength are grossly normal.  no obvious joint swelling.  gait is normal and steady.  EXTEMITIES: no deformity.  no ulcer on the feet.  feet are of normal color and temp.  no edema.   PULSES: dorsalis pedis intact bilat.  no carotid bruit NEURO:  cn 2-12 grossly intact.   readily moves all 4's.  sensation is intact to touch on the feet.  SKIN:  Normal texture and temperature.  No rash or suspicious lesion is visible.  At the right temporal area, there is a <1 cm raised slightly pigmented.   NODES:  None palpable at the neck PSYCH: alert, well-oriented.  Does not appear anxious nor depressed.    (pt had HTN response to exercise on treadmill)     Assessment & Plan:  HTN, situational Wellness visit today, with problems stable, except as noted.  Patient Instructions  I have sent a prescription to your pharmacy, for the blood pressure.   Please consider these measures for your health:  minimize alcohol.  Do not use tobacco products.  Have a colonoscopy at least every 10 years from age 56.  Keep firearms safely stored.  Always use seat belts.  have working smoke alarms in your home.  See an eye doctor and dentist regularly.  Never drive under the influence of alcohol or drugs (including prescription drugs).  Those with fair skin should take precautions  against the sun, and should carefully examine their skin once per month, for any new or changed moles.

## 2018-06-18 ENCOUNTER — Encounter: Payer: Self-pay | Admitting: Family Medicine

## 2018-06-18 ENCOUNTER — Ambulatory Visit (INDEPENDENT_AMBULATORY_CARE_PROVIDER_SITE_OTHER): Payer: No Typology Code available for payment source | Admitting: Family Medicine

## 2018-06-18 VITALS — BP 134/94 | HR 62 | Temp 98.0°F | Resp 20 | Ht 73.0 in | Wt 189.4 lb

## 2018-06-18 DIAGNOSIS — K219 Gastro-esophageal reflux disease without esophagitis: Secondary | ICD-10-CM

## 2018-06-18 DIAGNOSIS — E781 Pure hyperglyceridemia: Secondary | ICD-10-CM | POA: Diagnosis not present

## 2018-06-18 DIAGNOSIS — I10 Essential (primary) hypertension: Secondary | ICD-10-CM | POA: Diagnosis not present

## 2018-06-18 DIAGNOSIS — Z23 Encounter for immunization: Secondary | ICD-10-CM | POA: Diagnosis not present

## 2018-06-18 DIAGNOSIS — G479 Sleep disorder, unspecified: Secondary | ICD-10-CM

## 2018-06-18 DIAGNOSIS — Z125 Encounter for screening for malignant neoplasm of prostate: Secondary | ICD-10-CM | POA: Diagnosis not present

## 2018-06-18 LAB — COMPREHENSIVE METABOLIC PANEL
ALK PHOS: 72 U/L (ref 39–117)
ALT: 19 U/L (ref 0–53)
AST: 15 U/L (ref 0–37)
Albumin: 4.6 g/dL (ref 3.5–5.2)
BILIRUBIN TOTAL: 0.9 mg/dL (ref 0.2–1.2)
BUN: 17 mg/dL (ref 6–23)
CO2: 30 meq/L (ref 19–32)
CREATININE: 1.01 mg/dL (ref 0.40–1.50)
Calcium: 9.8 mg/dL (ref 8.4–10.5)
Chloride: 102 mEq/L (ref 96–112)
GFR: 82.34 mL/min (ref >60.00)
GLUCOSE: 94 mg/dL (ref 70–99)
Potassium: 4.4 mEq/L (ref 3.5–5.1)
SODIUM: 137 meq/L (ref 135–145)
TOTAL PROTEIN: 7.4 g/dL (ref 6.0–8.3)

## 2018-06-18 LAB — CBC WITH DIFFERENTIAL/PLATELET
Basophils Absolute: 0 10*3/uL (ref 0.0–0.1)
Basophils Relative: 0.8 % (ref 0.0–3.0)
EOS ABS: 0.3 10*3/uL (ref 0.0–0.7)
Eosinophils Relative: 5.4 % — ABNORMAL HIGH (ref 0.0–5.0)
HCT: 46.2 % (ref 39.0–52.0)
Hemoglobin: 16.1 g/dL (ref 13.0–17.0)
LYMPHS ABS: 2.5 10*3/uL (ref 0.7–4.0)
Lymphocytes Relative: 45.7 % (ref 12.0–46.0)
MCHC: 34.9 g/dL (ref 30.0–36.0)
MCV: 88.9 fl (ref 78.0–100.0)
MONO ABS: 0.7 10*3/uL (ref 0.1–1.0)
MONOS PCT: 12.6 % — AB (ref 3.0–12.0)
NEUTROS ABS: 1.9 10*3/uL (ref 1.4–7.7)
NEUTROS PCT: 35.5 % — AB (ref 43.0–77.0)
PLATELETS: 303 10*3/uL (ref 150.0–400.0)
RBC: 5.19 Mil/uL (ref 4.22–5.81)
RDW: 12.4 % (ref 11.5–15.5)
WBC: 5.4 10*3/uL (ref 4.0–10.5)

## 2018-06-18 LAB — LIPID PANEL
Cholesterol: 176 mg/dL (ref 0–200)
HDL: 57.2 mg/dL (ref >39.00)
NONHDL: 118.85
Total CHOL/HDL Ratio: 3
Triglycerides: 243 mg/dL — ABNORMAL HIGH (ref 0.0–149.0)
VLDL: 48.6 mg/dL — ABNORMAL HIGH (ref 0.0–40.0)

## 2018-06-18 LAB — PSA: PSA: 1 ng/mL (ref 0.10–4.00)

## 2018-06-18 LAB — LDL CHOLESTEROL, DIRECT: Direct LDL: 105 mg/dL

## 2018-06-18 LAB — TSH: TSH: 2.26 u[IU]/mL (ref 0.35–4.50)

## 2018-06-18 MED ORDER — TRAZODONE HCL 50 MG PO TABS: 50.0000 mg | ORAL_TABLET | Freq: Every day | ORAL | 1 refills | Status: DC

## 2018-06-18 MED ORDER — OMEPRAZOLE 20 MG PO CPDR: 20.0000 mg | DELAYED_RELEASE_CAPSULE | Freq: Every day | ORAL | 3 refills | Status: DC

## 2018-06-18 MED ORDER — AMLODIPINE BESYLATE 2.5 MG PO TABS: 2.5000 mg | ORAL_TABLET | Freq: Every day | ORAL | 1 refills | Status: DC

## 2018-06-18 NOTE — Progress Notes (Signed)
Patient ID: Micheal Lawson, male  DOB: [birth year], 52 y.o.   MRN: [id number] Patient Care Team    Relationship Specialty Notifications Start End  Ma Hillock, DO PCP - General Family Medicine  06/16/18   Milus Banister, MD Attending Physician Gastroenterology  06/18/18   Audie Box, MD Referring Physician Ophthalmology  06/18/18     Chief Complaint  Patient presents with  . Establish Care  . Hypertension    Subjective:  Micheal Lawson is a 52 y.o.  male present for new patient establishment/TOC. All past medical history, surgical history, allergies, family history, immunizations, medications and social history were updated in the electronic medical record today. All recent labs, ED visits and hospitalizations within the last year were reviewed.  Gastroesophageal reflux disease without esophagitis Use PPI a few times a week. Never been tried on lower dose.    Essential hypertension/hypertrig Pt reports compliance with losratan 25 mg, started a few weeks ago by prior PCP. Blood pressures ranges at home remain borderline systolic and elevated diastolic > 90 Patient denies chest pain, shortness of breath or lower extremity edema. He has elevated triglycerides in the past-he is not on medications.  BMP: collected today CBC: collected today Lipids: collected today TSH: collected today Exercise: routinely exercises. RF: HTN, FHX  Glaucoma: has had since about 2015. He is established w/ Dr. Gaspar Bidding [facility]. Condition has been stable on current medications.    Health maintenance:  Colonoscopy: completed 06/08/2016, by Dr. Edison Nasuti, resutls x1 hyperplastic polyp. follow up 10 years. Immunizations: tdap 03/30/2017 UTD, Influenza will get at work (encouraged yearly), Shingrix #1 provided today.  Infectious disease screening: HIV completed PSA: FHX in father > 38. No symptoms.  Lab Results  Component Value Date   PSA 1.00 06/18/2018   PSA 1.01 04/30/2017     PSA 0.99 04/16/2016   Assistive device: none Oxygen FBP:ZWCH Patient has a Dental home. Hospitalizations/ED visits:reviewed  Depression screen [facility] 2/9 06/18/2018  Decreased Interest 0  Down, Depressed, Hopeless 0  PHQ - 2 Score 0   No flowsheet data found.   Current Exercise Habits: Home exercise routine, Time (Minutes): 60, Frequency (Times/Week): 2, Weekly Exercise (Minutes/Week): 120, Intensity: Moderate Exercise limited by: None identified Fall Risk  06/18/2018  Falls in the past year? No   Immunization History  Administered Date(s) Administered  . Influenza Whole 12/27/2008  . Influenza-Unspecified 07/03/2017  . Td 05/08/2001, 12/27/2008  . Tdap 03/30/2017  . Zoster Recombinat (Shingrix) 06/18/2018    No exam data present  Past Medical History:  Diagnosis Date  . ALLERGIC RHINITIS 05/08/2007   Qualifier: Diagnosis of  By: Marca Ancona RMA, [name]    . Frequent headaches   . GLAUCOMA 04/01/2009   DR. Bryan at [facility]  . History of colon polyps    hyperplastic - Dr. Ardis Hughs  . Hypertension    No Known Allergies Past Surgical History:  Procedure Laterality Date  . CYST REMOVAL NECK    . ELECTROCARDIOGRAM  04/04/2007  . SHOULDER ARTHROSCOPY WITH ROTATOR CUFF REPAIR AND SUBACROMIAL DECOMPRESSION  09/26/2012   Procedure: SHOULDER ARTHROSCOPY WITH ROTATOR CUFF REPAIR AND SUBACROMIAL DECOMPRESSION;  Surgeon: Yvette Rack., MD;  Location: [facility];  Service: Orthopedics;  Laterality: Right;  RIGHT SHOULDER ARTHROSCOPY WITH EXTENSIVE DEBRIDEMENT, SUBACROMIAL DECOMPRESSION, PARTIAL ACROMIOPLASTY WITH CORACROMIAL RELEASE   Family History  Problem Relation Age of Onset  . Leukemia Sister   . Heart disease Father  CABG 65  . Hypertension Father   . Hyperlipidemia Father   . Prostate cancer Father 59  . Colon cancer Maternal Aunt   . Arthritis Mother   . Hyperlipidemia Mother   . Hypertension Mother   . Arthritis Maternal Grandmother   . Breast cancer Maternal  Grandmother   . Stroke Maternal Grandmother   . Arthritis Maternal Grandfather   . Arthritis Paternal Grandmother   . Arthritis Paternal Grandfather   . Heart disease Paternal Grandfather    Social History   Socioeconomic History  . Marital status: Married    Spouse name: Not on file  . Number of children: Not on file  . Years of education: Not on file  . Highest education level: Not on file  Occupational History  . 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 Financial resource strain: Not on file  . Food insecurity:    Worry: Not on file    Inability: Not on file  . Transportation needs:    Medical: Not on file    Non-medical: Not on file  Tobacco Use  . Smoking status: Never Smoker  . Smokeless tobacco: Never Used  Substance and Sexual Activity  . Alcohol use: Yes    Alcohol/week: 2.0 standard drinks    Types: 2 Cans of beer per week    Comment: occ  . Drug use: No  . Sexual activity: Yes    Partners: Female  Lifestyle  . Physical activity:    Days per week: Not on file    Minutes per session: Not on file  . Stress: Not on file  Relationships  . Social connections:    Talks on phone: Not on file    Gets together: Not on file    Attends religious service: Not on file    Active member of club or organization: Not on file    Attends meetings of clubs or organizations: Not on file    Relationship status: Not on file  . Intimate partner violence:    Fear of current or ex partner: Not on file    Emotionally abused: Not on file    Physically abused: Not on file    Forced sexual activity: Not on file  Other Topics Concern  . Not on file  Social History Narrative   Marital status/children/pets: Married   Education/employment: B.S., sys analyst for [employer]   Safety:      -Wears a bicycle helmet riding a bike: Yes     -smoke alarm in the home:Yes     - wears seatbelt: Yes     - Feels safe in their relationships: Yes   Allergies as  of 06/18/2018   No Known Allergies     Medication List        Accurate as of 06/18/18 11:59 PM. Always use your most recent med list.          amLODipine 2.5 MG tablet Commonly known as:  NORVASC Take 1 tablet (2.5 mg total) by mouth daily.   aspirin EC 81 MG tablet Take 81 mg by mouth daily.   cetirizine 10 MG tablet Commonly known as:  ZYRTEC Take 10 mg by mouth daily.   dorzolamide-timolol 22.3-6.8 MG/ML ophthalmic solution Commonly known as:  COSOPT Place 1 drop into both eyes 2 (two) times daily.   multivitamin tablet Take 1 tablet by mouth daily.   omeprazole 20 MG capsule Commonly known as:  PRILOSEC Take  1 capsule (20 mg total) by mouth daily.   Travoprost (BAK Free) 0.004 % Soln ophthalmic solution Commonly known as:  TRAVATAN Place 1 drop into both eyes daily.   traZODone 50 MG tablet Commonly known as:  DESYREL Take 1 tablet (50 mg total) by mouth at bedtime.       All past medical history, surgical history, allergies, family history, immunizations andmedications were updated in the EMR today and reviewed under the history and medication portions of their EMR.    Recent Results (from the past 2160 hour(s))  Exercise Tolerance Test     Status: None   Collection Time: 05/21/18  3:40 PM  Result Value Ref Range   Rest HR 73 bpm   Rest BP 134/99 mmHg   RPE 16    Exercise duration (sec) 0 sec   Percent HR 88 %   Exercise duration (min) 9 min   Estimated workload 10.1 METS   Peak HR 148 bpm   Peak BP 242/106 mmHg   MPHR 168 bpm  CBC w/Diff     Status: Abnormal   Collection Time: 06/18/18  9:32 AM  Result Value Ref Range   WBC 5.4 4.0 - 10.5 K/uL   RBC 5.19 4.22 - 5.81 Mil/uL   Hemoglobin 16.1 13.0 - 17.0 g/dL   HCT 46.2 39.0 - 52.0 %   MCV 88.9 78.0 - 100.0 fl   MCHC 34.9 30.0 - 36.0 g/dL   RDW 12.4 11.5 - 15.5 %   Platelets 303.0 150.0 - 400.0 K/uL   Neutrophils Relative % 35.5 (L) 43.0 - 77.0 %   Lymphocytes Relative 45.7 12.0 - 46.0 %    Monocytes Relative 12.6 (H) 3.0 - 12.0 %   Eosinophils Relative 5.4 (H) 0.0 - 5.0 %   Basophils Relative 0.8 0.0 - 3.0 %   Neutro Abs 1.9 1.4 - 7.7 K/uL   Lymphs Abs 2.5 0.7 - 4.0 K/uL   Monocytes Absolute 0.7 0.1 - 1.0 K/uL   Eosinophils Absolute 0.3 0.0 - 0.7 K/uL   Basophils Absolute 0.0 0.0 - 0.1 K/uL  Comp Met (CMET)     Status: None   Collection Time: 06/18/18  9:32 AM  Result Value Ref Range   Sodium 137 135 - 145 mEq/L   Potassium 4.4 3.5 - 5.1 mEq/L   Chloride 102 96 - 112 mEq/L   CO2 30 19 - 32 mEq/L   Glucose, Bld 94 70 - 99 mg/dL   BUN 17 6 - 23 mg/dL   Creatinine, Ser 1.01 0.40 - 1.50 mg/dL   Total Bilirubin 0.9 0.2 - 1.2 mg/dL   Alkaline Phosphatase 72 39 - 117 U/L   AST 15 0 - 37 U/L   ALT 19 0 - 53 U/L   Total Protein 7.4 6.0 - 8.3 g/dL   Albumin 4.6 3.5 - 5.2 g/dL   Calcium 9.8 8.4 - 10.5 mg/dL   GFR 82.34 >60.00 mL/min  Lipid panel     Status: Abnormal   Collection Time: 06/18/18  9:32 AM  Result Value Ref Range   Cholesterol 176 0 - 200 mg/dL    Comment: ATP III Classification       Desirable:  < 200 mg/dL               Borderline High:  200 - 239 mg/dL          High:  > = 240 mg/dL   Triglycerides 243.0 (H) 0.0 - 149.0 mg/dL  Comment: Normal:  <150 mg/dLBorderline High:  150 - 199 mg/dL   HDL 57.20 >39.00 mg/dL   VLDL 48.6 (H) 0.0 - 40.0 mg/dL   Total CHOL/HDL Ratio 3     Comment:                Men          Women1/2 Average Risk     3.4          3.3Average Risk          5.0          4.42X Average Risk          9.6          7.13X Average Risk          15.0          11.0                       NonHDL 118.85     Comment: NOTE:  Non-HDL goal should be 30 mg/dL higher than patient's LDL goal (i.e. LDL goal of < 70 mg/dL, would have non-HDL goal of < 100 mg/dL)  PSA     Status: None   Collection Time: 06/18/18  9:32 AM  Result Value Ref Range   PSA 1.00 0.10 - 4.00 ng/mL    Comment: Test performed using Access Hybritech PSA Assay, a parmagnetic partical,  chemiluminecent immunoassay.  TSH     Status: None   Collection Time: 06/18/18  9:32 AM  Result Value Ref Range   TSH 2.26 0.35 - 4.50 uIU/mL  LDL cholesterol, direct     Status: None   Collection Time: 06/18/18  9:32 AM  Result Value Ref Range   Direct LDL 105.0 mg/dL    Comment: Optimal:  <100 mg/dLNear or Above Optimal:  100-129 mg/dLBorderline High:  130-159 mg/dLHigh:  160-189 mg/dLVery High:  >190 mg/dL    ROS: 14 pt review of systems performed and negative (unless mentioned in an HPI)  Objective: BP (!) 134/94 (BP Location: Left Arm, Patient Position: Sitting, Cuff Size: Large)   Pulse 62   Temp 98 F (36.7 C)   Resp 20   Ht '6\' 1"'  (1.854 m)   Wt 189 lb 6 oz (85.9 kg)   SpO2 98%   BMI 24.99 kg/m  Gen: Afebrile. No acute distress. Nontoxic in appearance, well-developed, well-nourished,  Pleasant caucasian male.  HENT: AT. [place].  MMM, no oral lesions, no Cough on exam, no hoarseness on exam. Eyes:Pupils Equal Round Reactive to light, Extraocular movements intact,  Conjunctiva without redness, discharge or icterus. Neck/lymp/endocrine: Supple,no lymphadenopathy, no thyromegaly CV: RRR no murmur, no edema, +2/4 P posterior tibialis pulses. no carotid bruits. No JVD. Chest: CTAB, no wheeze, rhonchi or crackles.  Abd: Soft. flat. NTND. BS present. no Masses palpated. No hepatosplenomegaly. No rebound tenderness or guarding. Skin: no rashes, purpura or petechiae. Warm and well-perfused. Skin intact. Neuro/Msk:  Normal gait. PERLA. EOMi. Alert. Oriented x3.  Cranial nerves II through XII intact. Muscle strength 5/5 upper/lower extremity. DTRs equal bilaterally. Psych: Normal affect, dress and demeanor. Normal speech. Normal thought content and judgment.   Assessment/plan: Micheal Lawson is a 52 y.o. male present for est.  Gastroesophageal reflux disease without esophagitis Uses a few times a week.  Lower dose to omeprazole 20 mg QD PRN .  Screening for prostate cancer Fhx in  father. Pt without urinary changes or discomfort.  - PSA  Essential  hypertension/hypertrig - borderline BP. No changes from prior to med. Will try CCB, for increase diastolic pressures instead.  - DC losartan. Start amlodipine 2.5 mg QD, nurse visit 1-2 weeks for BP recheck and tapering of med if needed.  - consider fish oil supplement or low dose statin if abnormal.  - CBC w/Diff - Comp Met (CMET) - Lipid panel - TSH  Sleep disturbance:  - use as trazodone PRN. Refills provided today.   Immunization due: Shingrix #1 provided today. Nurse visit Shingrix #2 in 2-6 mos.  Return in about 6 months (around 12/17/2018) for HTN'. Yearly for CPE  Note is dictated utilizing voice recognition software. Although note has been proof read prior to signing, occasional typographical errors still can be missed. If any questions arise, please do not hesitate to call for verification.  Electronically signed by: Howard Pouch, DO [place]

## 2018-06-18 NOTE — Patient Instructions (Addendum)
It was a pleasure meeting you today.  Let us know when you get the flu vaccine so we can update your chart and it will take you off the Robo-calls for flu vac.  Shingrix #1 provided today--> nurse visit in 3 months for #2 shingrix.  BP: stop losartan and start amlodipine daily--> nurse visit 1-2 weeks after start new med and we will adjust if needed. Once stable, follow ups every 6 months.   We will call you with all lab results.  Please help [place] help you:  We are honored you have chosen [facility] for your Primary Care home. Below you will find basic instructions that you may need to access in the future. Please help [place] help you by reading the instructions, which cover many of the frequent questions we experience.   Prescription refills and request:  -In order to allow more efficient response time, please call your pharmacy for all refills. They will forward the request electronically to [place]. This allows for the quickest possible response. Request left on a nurse line can take longer to refill, since these are checked as time allows between office patients and other phone calls.  - refill request can take up to 3-5 working days to complete.  - If request is sent electronically and request is appropiate, it is usually completed in 1-2 business days.  - all patients will need to be seen routinely for all chronic medical conditions requiring prescription medications (see follow-up below). If you are overdue for follow up on your condition, you will be asked to make an appointment and we will call in enough medication to cover you until your appointment (up to 30 days).  - all controlled substances will require a face to face visit to request/refill.  - if you desire your prescriptions to go through a new pharmacy, and have an active script at original pharmacy, you will need to call your pharmacy and have scripts transferred to new pharmacy. This is completed between the pharmacy locations and not  by your provider.    Results: If any images or labs were ordered, it can take up to 1 week to get results depending on the test ordered and the lab/facility running and resulting the test. - Normal or stable results, which do not need further discussion, may be released to your mychart immediately with attached note to you. A call may not be generated for normal results. Please make certain to sign up for mychart. If you have questions on how to activate your mychart you can call the front office.  - If your results need further discussion, our office will attempt to contact you via phone, and if unable to reach you after 2 attempts, we will release your abnormal result to your mychart with instructions.  - All results will be automatically released in mychart after 1 week.  - Your provider will provide you with explanation and instruction on all relevant material in your results. Please keep in mind, results and labs may appear confusing or abnormal to the untrained eye, but it does not mean they are actually abnormal for you personally. If you have any questions about your results that are not covered, or you desire more detailed explanation than what was provided, you should make an appointment with your provider to do so.   Our office handles many outgoing and incoming calls daily. If we have not contacted you within 1 week about your results, please check your mychart to see  if there is a message first and if not, then contact our office.  In helping with this matter, you help decrease call volume, and therefore allow [place] to be able to respond to patients needs more efficiently.   Acute office visits (sick visit):  An acute visit is intended for a new problem and are scheduled in shorter time slots to allow schedule openings for patients with new problems. This is the appropriate visit to discuss a new problem. Problems will not be addressed by phone call or Echart message. Appointment is needed if  requesting treatment. In order to provide you with excellent quality medical care with proper time for you to explain your problem, have an exam and receive treatment with instructions, these appointments should be limited to one new problem per visit. If you experience a new problem, in which you desire to be addressed, please make an acute office visit, we save openings on the schedule to accommodate you. Please do not save your new problem for any other type of visit, let us take care of it properly and quickly for you.   Follow up visits:  Depending on your condition(s) your provider will need to see you routinely in order to provide you with quality care and prescribe medication(s). Most chronic conditions (Example: hypertension, Diabetes, depression/anxiety... etc), require visits a couple times a year. Your provider will instruct you on proper follow up for your personal medical conditions and history. Please make certain to make follow up appointments for your condition as instructed. Failing to do so could result in lapse in your medication treatment/refills. If you request a refill, and are overdue to be seen on a condition, we will always provide you with a 30 day script (once) to allow you time to schedule.    Medicare wellness (well visit): - we have a wonderful Nurse Maudie Mercury), that will meet with you and provide you will yearly medicare wellness visits. These visits should occur yearly (can not be scheduled less than 1 calendar year apart) and cover preventive health, immunizations, advance directives and screenings you are entitled to yearly through your medicare benefits. Do not miss out on your entitled benefits, this is when medicare will pay for these benefits to be ordered for you.  These are strongly encouraged by your provider and is the appropriate type of visit to make certain you are up to date with all preventive health benefits. If you have not had your medicare wellness exam in the  last 12 months, please make certain to schedule one by calling the office and schedule your medicare wellness with Maudie Mercury as soon as possible.   Yearly physical (well visit):  - Adults are recommended to be seen yearly for physicals. Check with your insurance and date of your last physical, most insurances require one calendar year between physicals. Physicals include all preventive health topics, screenings, medical exam and labs that are appropriate for gender/age and history. You may have fasting labs needed at this visit. This is a well visit (not a sick visit), new problems should not be covered during this visit (see acute visit).  - Pediatric patients are seen more frequently when they are younger. Your provider will advise you on well child visit timing that is appropriate for your their age. - This is not a medicare wellness visit. Medicare wellness exams do not have an exam portion to the visit. Some medicare companies allow for a physical, some do not allow a yearly physical. If your medicare  allows a yearly physical you can schedule the medicare wellness with our nurse Maudie Mercury and have your physical with your provider after, on the same day. Please check with insurance for your full benefits.   Late Policy/No Shows:  - all new patients should arrive 15-30 minutes earlier than appointment to allow [place] time  to  obtain all personal demographics,  insurance information and for you to complete office paperwork. - All established patients should arrive 10-15 minutes earlier than appointment time to update all information and be checked in .  - In our best efforts to run on time, if you are late for your appointment you will be asked to either reschedule or if able, we will work you back into the schedule. There will be a wait time to work you back in the schedule,  depending on availability.  - If you are unable to make it to your appointment as scheduled, please call 24 hours ahead of time to allow [place] to  fill the time slot with someone else who needs to be seen. If you do not cancel your appointment ahead of time, you may be charged a no show fee.

## 2018-06-19 ENCOUNTER — Encounter: Payer: Self-pay | Admitting: Family Medicine

## 2018-06-19 DIAGNOSIS — E781 Pure hyperglyceridemia: Secondary | ICD-10-CM | POA: Insufficient documentation

## 2018-06-19 DIAGNOSIS — G479 Sleep disorder, unspecified: Secondary | ICD-10-CM | POA: Insufficient documentation

## 2018-06-27 ENCOUNTER — Encounter: Payer: Self-pay | Admitting: Family Medicine

## 2018-06-27 ENCOUNTER — Ambulatory Visit: Payer: No Typology Code available for payment source | Admitting: Family Medicine

## 2018-06-27 VITALS — BP 118/80 | HR 71

## 2018-06-27 DIAGNOSIS — I10 Essential (primary) hypertension: Secondary | ICD-10-CM

## 2018-06-27 NOTE — Progress Notes (Addendum)
Micheal Lawson is a 52 y.o. male presents to the office today for Blood pressure recheck secondary to  D/C losartan, start Amlodipine 2.5mg  Blood pressure medication: Amlodpine 2.5 mg If on medication, Last dose was at least 1-2 hours prior to recheck: No - takes medication at bedtime. ( 10pm ) Blood pressure was taken in the left arm after patient rested for 5 minutes.  BP 118/80 (BP Location: Left Arm, Patient Position: Sitting, Cuff Size: Normal)   Pulse 71   [name], [name]  Please call pt and instruct him of the following; - BP perfect. Continue current dose of amlodipine 2.5 mg QD. F/U 6 mos. - There are refills at his pharm for this med.

## 2018-06-27 NOTE — Progress Notes (Signed)
Left detailed message on patient's phone of information provided.

## 2018-08-07 ENCOUNTER — Telehealth: Payer: Self-pay | Admitting: Family Medicine

## 2018-08-07 NOTE — Telephone Encounter (Signed)
Left message for patient he will need an office visit for evaluation of his symptoms.

## 2018-08-07 NOTE — Telephone Encounter (Signed)
Copied from [place] [id number]. Topic: Referral - Request for Referral >> Aug 07, 2018  8:53 AM Cecelia Byars, NT wrote: Has patient seen PCP for this complaint? yes  *If NO, is insurance requiring patient see PCP for this issue before PCP can refer them?   Referral for which specialty:  chiropractorc  Preferred provider/office:  Dr Alda Berthold  Reason for referral: insurance requirement

## 2018-08-07 NOTE — Telephone Encounter (Signed)
I have not seen him for any msk issue. We saw him once for an establish appt/CPE and HTN.  He has nothing on his current problem list or past medical history either. Further more, phone note does not even provide the problem he wants to see the chiropractor for.  I have to be honest- I have never referred anyone to a chiropractor or been asked to do so. Not that I wouldn't, but I would need to evaluate him in order to refer him.

## 2018-08-11 ENCOUNTER — Ambulatory Visit (INDEPENDENT_AMBULATORY_CARE_PROVIDER_SITE_OTHER): Payer: No Typology Code available for payment source | Admitting: Family Medicine

## 2018-08-11 ENCOUNTER — Encounter: Payer: Self-pay | Admitting: Family Medicine

## 2018-08-11 VITALS — BP 128/82 | HR 54 | Temp 97.8°F | Resp 20 | Ht 73.0 in | Wt 190.2 lb

## 2018-08-11 DIAGNOSIS — L989 Disorder of the skin and subcutaneous tissue, unspecified: Secondary | ICD-10-CM

## 2018-08-11 DIAGNOSIS — S39012A Strain of muscle, fascia and tendon of lower back, initial encounter: Secondary | ICD-10-CM | POA: Diagnosis not present

## 2018-08-11 MED ORDER — MELOXICAM 15 MG PO TABS: 15.0000 mg | ORAL_TABLET | Freq: Every day | ORAL | 0 refills | Status: DC

## 2018-08-11 MED ORDER — CYCLOBENZAPRINE HCL 5 MG PO TABS: 5.0000 mg | ORAL_TABLET | Freq: Every day | ORAL | 0 refills | Status: DC

## 2018-08-11 NOTE — Patient Instructions (Addendum)
mobic prescribed, if you decide to start it is a once daily med with food and helps decrease antiinflammatory.  Flexeril prescribed for night time relief, may cause sedation.  I have referred to you chiropractor .  Heat/ice, rest. NSAIDS.  Stretches.    Low Back Sprain A sprain is a stretch or tear in the bands of tissue that hold bones and joints together (ligaments). Sprains of the lower back (lumbar spine) are a common cause of low back pain. A sprain occurs when ligaments are overextended or stretched beyond their limits. The ligaments can become inflamed, resulting in pain and sudden muscle tightening (spasms). A sprain can be caused by an injury (trauma), or it can develop gradually due to overuse. There are three types of sprains:  Grade 1 is a mild sprain involving an overstretched ligament or a very slight tear of the ligament.  Grade 2 is a moderate sprain involving a partial tear of the ligament.  Grade 3 is a severe sprain involving a complete tear of the ligament.  What are the causes? This condition may be caused by:  Trauma, such as a fall or a hit to the body.  Twisting or overstretching the back. This may result from doing activities that require a lot of energy, such as lifting heavy objects.  What increases the risk? The following factors may increase your risk of getting this condition:  Playing contact sports.  Participating in sports or activities that put excessive stress on the back and require a lot of bending and twisting, including: ? Lifting weights or heavy objects. ? Gymnastics. ? Soccer. ? Figure skating. ? Snowboarding.  Being overweight or obese.  Having poor strength and flexibility.  What are the signs or symptoms? Symptoms of this condition may include:  Sharp or dull pain in the lower back that does not go away. Pain may extend to the buttocks.  Stiffness.  Limited range of motion.  Inability to stand up straight due to stiffness  or pain.  Muscle spasms.  How is this diagnosed?  This condition may be diagnosed based on:  Your symptoms.  Your medical history.  A physical exam. ? Your health care provider may push on certain areas of your back to determine the source of your pain. ? You may be asked to bend forward, backward, and side to side to assess the severity of your pain and your range of motion.  Imaging tests, such as: ? X-rays. ? MRI.  How is this treated? Treatment for this condition may include:  Applying heat and cold to the affected area.  Medicines to help relieve pain and to relax your muscles (muscle relaxants).  NSAIDs to help reduce swelling and discomfort.  Physical therapy.  When your symptoms improve, it is important to gradually return to your normal routine as soon as possible to reduce pain, avoid stiffness, and avoid loss of muscle strength. Generally, symptoms should improve within 6 weeks of treatment. However, recovery time varies. Follow these instructions at home: Managing pain, stiffness, and swelling  If directed, apply ice to the injured area during the first 24 hours after your injury. ? Put ice in a plastic bag. ? Place a towel between your skin and the bag. ? Leave the ice on for 20 minutes, 2-3 times a day.  If directed, apply heat to the affected area as often as told by your health care provider. Use the heat source that your health care provider recommends, such as a moist  heat pack or a heating pad. ? Place a towel between your skin and the heat source. ? Leave the heat on for 20-30 minutes. ? Remove the heat if your skin turns bright red. This is especially important if you are unable to feel pain, heat, or cold. You may have a greater risk of getting burned. Activity  Rest and return to your normal activities as told by your health care provider. Ask your health care provider what activities are safe for you.  Avoid activities that take a lot of effort  (are strenuous) for as long as told by your health care provider.  Do exercises as told by your health care provider. General instructions   Take over-the-counter and prescription medicines only as told by your health care provider.  If you have questions or concerns about safety while taking pain medicine, talk with your health care provider.  Do not drive or operate heavy machinery until you know how your pain medicine affects you.  Do not use any tobacco products, such as cigarettes, chewing tobacco, and e-cigarettes. Tobacco can delay bone healing. If you need help quitting, ask your health care provider.  Keep all follow-up visits as told by your health care provider. This is important. How is this prevented?  Warm up and stretch before being active.  Cool down and stretch after being active.  Give your body time to rest between periods of activity.  Avoid: ? Being physically inactive for long periods at a time. ? Exercising or playing sports when you are tired or in pain.  Use correct form when playing sports and lifting heavy objects.  Use good posture when sitting and standing.  Maintain a healthy weight.  Sleep on a mattress with medium firmness to support your back.  Make sure to use equipment that fits you, including shoes that fit well.  Be safe and responsible while being active to avoid falls.  Do at least 150 minutes of moderate-intensity exercise each week, such as brisk walking or water aerobics. Try a form of exercise that takes stress off your back, such as swimming or stationary cycling.  Maintain physical fitness, including: ? Strength. In particular, develop and maintain strong abdominal muscles. ? Flexibility. ? Cardiovascular fitness. ? Endurance. Contact a health care provider if:  Your back pain does not improve after 6 weeks of treatment.  Your symptoms get worse. Get help right away if:  Your back pain is severe.  You are unable to  stand or walk.  You develop pain in your legs.  You develop weakness in your buttocks or legs.  You have difficulty controlling when you urinate or when you have a bowel movement. This information is not intended to replace advice given to you by your health care provider. Make sure you discuss any questions you have with your health care provider. Document Released: 09/24/2005 Document Revised: 05/31/2016 Document Reviewed: 07/06/2015 Elsevier Interactive Patient Education  [employer].

## 2018-08-11 NOTE — Progress Notes (Signed)
Micheal Lawson , Apr 18, 1966, 52 y.o., male MRN: [id number] Patient Care Team    Relationship Specialty Notifications Start End  Ma Hillock, DO PCP - General Family Medicine  06/16/18   Milus Banister, MD Attending Physician Gastroenterology  06/18/18   Audie Box, MD Referring Physician Ophthalmology  06/18/18     Chief Complaint  Patient presents with  . Back Pain    lumbar pain      Subjective: Pt presents for an OV with complaints of lumbar back pain of 1-2 weeks duration.  Associated symptoms include pain left lower lumbar with ROM.  Has had a chiropractor performed manipulations on him a few times a year in the past.  He reports this issue started about 1 to 2 weeks ago after he was installing a hitch on his car.  He states taking ibuprofen helps occasionally.  He denies any back injury or surgery in the past.  He also routinely sees dermatology, Dr. Ledell Peoples office for skin lesions.  Depression screen PHQ 2/9 06/18/2018  Decreased Interest 0  Down, Depressed, Hopeless 0  PHQ - 2 Score 0    No Known Allergies Social History   Tobacco Use  . Smoking status: Never Smoker  . Smokeless tobacco: Never Used  Substance Use Topics  . Alcohol use: Yes    Alcohol/week: 2.0 standard drinks    Types: 2 Cans of beer per week    Comment: occ   Past Medical History:  Diagnosis Date  . ALLERGIC RHINITIS 05/08/2007   Qualifier: Diagnosis of  By: Marca Ancona RMA, [name]    . Frequent headaches   . GLAUCOMA 04/01/2009   DR. Bryan at [facility]  . History of colon polyps    hyperplastic - Dr. Ardis Hughs  . Hypertension    Past Surgical History:  Procedure Laterality Date  . CYST REMOVAL NECK    . ELECTROCARDIOGRAM  04/04/2007  . SHOULDER ARTHROSCOPY WITH ROTATOR CUFF REPAIR AND SUBACROMIAL DECOMPRESSION  09/26/2012   Procedure: SHOULDER ARTHROSCOPY WITH ROTATOR CUFF REPAIR AND SUBACROMIAL DECOMPRESSION;  Surgeon: Yvette Rack., MD;  Location: [facility];  Service:  Orthopedics;  Laterality: Right;  RIGHT SHOULDER ARTHROSCOPY WITH EXTENSIVE DEBRIDEMENT, SUBACROMIAL DECOMPRESSION, PARTIAL ACROMIOPLASTY WITH CORACROMIAL RELEASE   Family History  Problem Relation Age of Onset  . Leukemia Sister   . Heart disease Father        CABG 64  . Hypertension Father   . Hyperlipidemia Father   . Prostate cancer Father 53  . Colon cancer Maternal Aunt   . Arthritis Mother   . Hyperlipidemia Mother   . Hypertension Mother   . Arthritis Maternal Grandmother   . Breast cancer Maternal Grandmother   . Stroke Maternal Grandmother   . Arthritis Maternal Grandfather   . Arthritis Paternal Grandmother   . Arthritis Paternal Grandfather   . Heart disease Paternal Grandfather    Allergies as of 08/11/2018   No Known Allergies     Medication List        Accurate as of 08/11/18 12:15 PM. Always use your most recent med list.          amLODipine 2.5 MG tablet Commonly known as:  NORVASC Take 1 tablet (2.5 mg total) by mouth daily.   aspirin EC 81 MG tablet Take 81 mg by mouth daily.   cetirizine 10 MG tablet Commonly known as:  ZYRTEC Take 10 mg by mouth daily.   cyclobenzaprine 5 MG tablet  Commonly known as:  FLEXERIL Take 1 tablet (5 mg total) by mouth at bedtime.   dorzolamide-timolol 22.3-6.8 MG/ML ophthalmic solution Commonly known as:  COSOPT Place 1 drop into both eyes 2 (two) times daily.   meloxicam 15 MG tablet Commonly known as:  MOBIC Take 1 tablet (15 mg total) by mouth daily.   multivitamin tablet Take 1 tablet by mouth daily.   omeprazole 20 MG capsule Commonly known as:  PRILOSEC Take 1 capsule (20 mg total) by mouth daily.   Travoprost (BAK Free) 0.004 % Soln ophthalmic solution Commonly known as:  TRAVATAN Place 1 drop into both eyes daily.   traZODone 50 MG tablet Commonly known as:  DESYREL Take 1 tablet (50 mg total) by mouth at bedtime.       All past medical history, surgical history, allergies, family  history, immunizations andmedications were updated in the EMR today and reviewed under the history and medication portions of their EMR.     ROS: Negative, with the exception of above mentioned in HPI   Objective:  BP 128/82 (BP Location: Left Arm, Patient Position: Sitting, Cuff Size: Large)   Pulse (!) 54   Temp 97.8 F (36.6 C)   Resp 20   Ht 6\' 1"  (1.854 m)   Wt 190 lb 4 oz (86.3 kg)   SpO2 98%   BMI 25.10 kg/m  Body mass index is 25.1 kg/m. Gen: Afebrile. No acute distress. Nontoxic in appearance, well developed, well nourished.  HENT: AT. [place].MMM Eyes:Pupils Equal Round Reactive to light, Extraocular movements intact,  Conjunctiva without redness, discharge or icterus. MSK: No erythema, soft tissue swelling.  No tenderness to palpation.  Very mild ropiness left paraspinal.  Full range of motion with discomfort on left side bending and flexion.  Negative straight leg raises bilaterally.  Negative FABRE bilaterally. DTR equal BLE. Neurovascularly intact distally. Neuro: Normal gait. PERLA. EOMi. Alert. Oriented x3   No exam data present No results found. No results found for this or any previous visit (from the past 24 hour(s)).  Assessment/Plan: HERVE HAUG is a 52 y.o. male present for OV for  Lumbar strain, initial encounter - discussed rest. Ice/heat. Nsaids. Stretches. Massage. No red flags on exam.  - printed scripts for : Mobic Qd and flexeril QHS, in the event symptoms worsen he can start.  - Ambulatory referral to Chiropractic - F/U PRN  Skin lesions - Ambulatory referral to Dermatology   Reviewed expectations re: course of current medical issues.  Discussed self-management of symptoms.  Outlined signs and symptoms indicating need for more acute intervention.  Patient verbalized understanding and all questions were answered.  Patient received an After-Visit Summary.    Orders Placed This Encounter  Procedures  . Ambulatory referral to  Chiropractic  . Ambulatory referral to Dermatology     Note is dictated utilizing voice recognition software. Although note has been proof read prior to signing, occasional typographical errors still can be missed. If any questions arise, please do not hesitate to call for verification.   electronically signed by:  Howard Pouch, DO  [place]

## 2018-09-17 ENCOUNTER — Ambulatory Visit (INDEPENDENT_AMBULATORY_CARE_PROVIDER_SITE_OTHER): Payer: No Typology Code available for payment source

## 2018-09-17 DIAGNOSIS — Z23 Encounter for immunization: Secondary | ICD-10-CM | POA: Diagnosis not present
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
## 2018-12-11 ENCOUNTER — Other Ambulatory Visit: Payer: Self-pay | Admitting: Family Medicine

## 2018-12-11 DIAGNOSIS — I10 Essential (primary) hypertension: Secondary | ICD-10-CM

## 2018-12-11 NOTE — Telephone Encounter (Signed)
Called patient to schedule follow up visit. Pt has appt on 12/15/18 and med was sent to last until then.

## 2018-12-15 ENCOUNTER — Ambulatory Visit (INDEPENDENT_AMBULATORY_CARE_PROVIDER_SITE_OTHER): Payer: No Typology Code available for payment source | Admitting: Family Medicine

## 2018-12-15 ENCOUNTER — Encounter: Payer: Self-pay | Admitting: Family Medicine

## 2018-12-15 VITALS — BP 133/69 | HR 64 | Temp 98.0°F | Resp 16 | Ht 73.0 in | Wt 195.1 lb

## 2018-12-15 DIAGNOSIS — E781 Pure hyperglyceridemia: Secondary | ICD-10-CM

## 2018-12-15 DIAGNOSIS — I10 Essential (primary) hypertension: Secondary | ICD-10-CM

## 2018-12-15 DIAGNOSIS — K219 Gastro-esophageal reflux disease without esophagitis: Secondary | ICD-10-CM | POA: Diagnosis not present

## 2018-12-15 MED ORDER — AMLODIPINE BESYLATE 2.5 MG PO TABS: 2.5000 mg | ORAL_TABLET | Freq: Every day | ORAL | 1 refills | Status: DC

## 2018-12-15 NOTE — Progress Notes (Signed)
Patient ID: Micheal Lawson, male  DOB: [birth year], 53 y.o.   MRN: [id number] Patient Care Team    Relationship Specialty Notifications Start End  Ma Hillock, DO PCP - General Family Medicine  06/16/18   Milus Banister, MD Attending Physician Gastroenterology  06/18/18   Audie Box, MD Referring Physician Ophthalmology  06/18/18     Chief Complaint  Patient presents with  . Hypertension    Not fasting. No complaints     Subjective:  Micheal Lawson is a 53 y.o.  male present for [facility]  Gastroesophageal reflux disease without esophagitis Use PPI a few times a week. Stable. No complaint.s   Essential hypertension/hypertrig Pt reports compliance with amlodipine. Patient denies chest pain, shortness of breath, dizziness or lower extremity edema. He did start fish oil 1000 mg.  BMP: 06/2018 CBC: 06/2018 Lipids: 06/2018- elevated triglycerides--> start fish oil and exercise routinely. TSH: 06/2018 Exercise: routinely exercises. RF: HTN, FHX, hypertrig  Depression screen [facility] 2/9 06/18/2018  Decreased Interest 0  Down, Depressed, Hopeless 0  PHQ - 2 Score 0   No flowsheet data found.     Fall Risk  06/18/2018  Falls in the past year? No   Immunization History  Administered Date(s) Administered  . Influenza Whole 12/27/2008  . Influenza-Unspecified 07/03/2017  . Td 05/08/2001, 12/27/2008  . Tdap 03/30/2017  . Zoster Recombinat (Shingrix) 06/18/2018, 09/17/2018    No exam data present  Past Medical History:  Diagnosis Date  . ALLERGIC RHINITIS 05/08/2007   Qualifier: Diagnosis of  By: Marca Ancona RMA, [name]    . Frequent headaches   . GLAUCOMA 04/01/2009   DR. Bryan at [facility]  . History of colon polyps    hyperplastic - Dr. Ardis Hughs  . Hypertension    No Known Allergies Past Surgical History:  Procedure Laterality Date  . CYST REMOVAL NECK    . ELECTROCARDIOGRAM  04/04/2007  . SHOULDER ARTHROSCOPY WITH ROTATOR CUFF REPAIR AND SUBACROMIAL  DECOMPRESSION  09/26/2012   Procedure: SHOULDER ARTHROSCOPY WITH ROTATOR CUFF REPAIR AND SUBACROMIAL DECOMPRESSION;  Surgeon: Yvette Rack., MD;  Location: [place];  Service: Orthopedics;  Laterality: Right;  RIGHT SHOULDER ARTHROSCOPY WITH EXTENSIVE DEBRIDEMENT, SUBACROMIAL DECOMPRESSION, PARTIAL ACROMIOPLASTY WITH CORACROMIAL RELEASE   Family History  Problem Relation Age of Onset  . Leukemia Sister   . Heart disease Father        CABG 37  . Hypertension Father   . Hyperlipidemia Father   . Prostate cancer Father 75  . Colon cancer Maternal Aunt   . Arthritis Mother   . Hyperlipidemia Mother   . Hypertension Mother   . Arthritis Maternal Grandmother   . Breast cancer Maternal Grandmother   . Stroke Maternal Grandmother   . Arthritis Maternal Grandfather   . Arthritis Paternal Grandmother   . Arthritis Paternal Grandfather   . Heart disease Paternal Grandfather    Social History   Socioeconomic History  . Marital status: Married    Spouse name: Not on file  . Number of children: Not on file  . Years of education: Not on file  . Highest education level: Not on file  Occupational History  . 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 Financial resource strain: Not on file  . Food insecurity:    Worry: Not on file    Inability: Not on file  . Transportation needs:    Medical:  Not on file    Non-medical: Not on file  Tobacco Use  . Smoking status: Never Smoker  . Smokeless tobacco: Never Used  Substance and Sexual Activity  . Alcohol use: Yes    Alcohol/week: 2.0 standard drinks    Types: 2 Cans of beer per week    Comment: occ  . Drug use: No  . Sexual activity: Yes    Partners: Female  Lifestyle  . Physical activity:    Days per week: Not on file    Minutes per session: Not on file  . Stress: Not on file  Relationships  . Social connections:    Talks on phone: Not on file    Gets together: Not on file    Attends religious  service: Not on file    Active member of club or organization: Not on file    Attends meetings of clubs or organizations: Not on file    Relationship status: Not on file  . Intimate partner violence:    Fear of current or ex partner: Not on file    Emotionally abused: Not on file    Physically abused: Not on file    Forced sexual activity: Not on file  Other Topics Concern  . Not on file  Social History Narrative   Marital status/children/pets: Married   Education/employment: B.S., sys analyst for [employer]   Safety:      -Wears a bicycle helmet riding a bike: Yes     -smoke alarm in the home:Yes     - wears seatbelt: Yes     - Feels safe in their relationships: Yes   Allergies as of 12/15/2018   No Known Allergies     Medication List       Accurate as of December 15, 2018  3:12 PM. Always use your most recent med list.        amLODipine 2.5 MG tablet Commonly known as:  NORVASC Take 1 tablet (2.5 mg total) by mouth daily.   aspirin EC 81 MG tablet Take 81 mg by mouth daily.   cetirizine 10 MG tablet Commonly known as:  ZYRTEC Take 10 mg by mouth daily.   cyclobenzaprine 5 MG tablet Commonly known as:  FLEXERIL Take 1 tablet (5 mg total) by mouth at bedtime.   dorzolamide-timolol 22.3-6.8 MG/ML ophthalmic solution Commonly known as:  COSOPT Place 1 drop into both eyes 2 (two) times daily.   multivitamin tablet Take 1 tablet by mouth daily.   omeprazole 20 MG capsule Commonly known as:  PRILOSEC Take 1 capsule (20 mg total) by mouth daily.   Travoprost (BAK Free) 0.004 % Soln ophthalmic solution Commonly known as:  TRAVATAN Place 1 drop into both eyes daily.       All past medical history, surgical history, allergies, family history, immunizations andmedications were updated in the EMR today and reviewed under the history and medication portions of their EMR.    No results found for this or any previous visit (from the past 2160 hour(s)).  ROS: 14 pt review of  systems performed and negative (unless mentioned in an HPI)  Objective: BP 133/69 (BP Location: Right Arm, Patient Position: Sitting, Cuff Size: Normal)   Pulse 64   Temp 98 F (36.7 C) (Oral)   Resp 16   Ht 6\' 1"  (1.854 m)   Wt 195 lb 2 oz (88.5 kg)   BMI 25.74 kg/m  Gen: Afebrile. No acute distress. Nontoxic in appearance.  HENT: AT. [place].  MMM.  Eyes:Pupils Equal Round Reactive to light, Extraocular movements intact,  Conjunctiva without redness, discharge or icterus. Neck/lymp/endocrine: Supple,no lymphadenopathy, no thyromegaly CV: RRR no murmur, no edema, +2/4 P posterior tibialis pulses Chest: CTAB, no wheeze or crackles Neuro:  Normal gait. PERLA. EOMi. Alert. Oriented x3  Assessment/plan: EVAN MACKIE is a 53 y.o. male present for est.  Gastroesophageal reflux disease without esophagitis Still using a few times a week.  Continue omeprazole 20 mg QD PRN .  Essential hypertension/hypertrig - stable, but borderline.  - Monitor BP at home, if > 135/85 routinely then will need to increase amlodipine dose. . - continue amlodipine 2.5 mg Q - continue  fish oil supplement ---> rpt fasting lipids future within 1 week - If still elevated tg will need to consider fenofibrate.     Return in about 6 months (around 06/17/2019) for CPE.  Note is dictated utilizing voice recognition software. Although note has been proof read prior to signing, occasional typographical errors still can be missed. If any questions arise, please do not hesitate to call for verification.  Electronically signed by: Howard Pouch, DO [place]

## 2018-12-15 NOTE — Patient Instructions (Signed)
Fasting lab appt this week.  Check your fish oil dose.  Get a BP cuff, monitor a few times a week. If BP > 135/85 routinely we will need to increase your amlodipine to 5 mg a day.     High Triglycerides Eating Plan Triglycerides are a type of fat in the blood. High levels of triglycerides can increase your risk of heart disease and stroke. If your triglyceride levels are high, choosing the right foods can help lower your triglycerides and keep your heart healthy. Work with your health care provider or a diet and nutrition specialist (dietitian) to develop an eating plan that is right for you. What are tips for following this plan? General guidelines   Lose weight, if you are overweight. For most people, losing 5-10 lbs (2-5 kg) helps lower triglyceride levels. A weight-loss plan may include. ? 30 minutes of exercise at least 5 days a week. ? Reducing the amount of calories, sugar, and fat you eat.  Eat a wide variety of fresh fruits, vegetables, and whole grains. These foods are high in fiber.  Eat foods that contain healthy fats, such as fatty fish, nuts, seeds, and olive oil.  Avoid foods that are high in added sugar, added salt (sodium), saturated fat, and trans fat.  Avoid low-fiber, refined carbohydrates such as white bread, crackers, noodles, and white rice.  Avoid foods with partially hydrogenated oils (trans fats), such as fried foods or stick margarine.  Limit alcohol intake to no more than 1 drink a day for nonpregnant women and 2 drinks a day for men. One drink equals 12 oz of beer, 5 oz of wine, or 1 oz of hard liquor. Your health care provider may recommend that you drink less depending on your overall health. Reading food labels  Check food labels for the amount of saturated fat. Choose foods with no or very little saturated fat.  Check food labels for the amount of trans fat. Choose foods with no trans fat.  Check food labels for the amount of cholesterol. Choose  foods low in cholesterol. Ask your dietitian how much cholesterol you should have each day.  Check food labels for the amount of sodium. Choose foods with less than 140 milligrams (mg) per serving. Shopping  Buy dairy products labeled as nonfat (skim) or low-fat (1%).  Avoid buying processed or prepackaged foods. These are often high in added sugar, sodium, and fat. Cooking  Choose healthy fats when cooking, such as olive oil or canola oil.  Cook foods using lower fat methods, such as baking, broiling, boiling, or grilling.  Make your own sauces, dressings, and marinades when possible, instead of buying them. Store-bought sauces, dressings, and marinades are often high in sodium and sugar. Meal planning  Eat more home-cooked food and less restaurant, buffet, and fast food.  Eat fatty fish at least 2 times each week. Examples of fatty fish include salmon, trout, mackerel, tuna, and herring.  If you eat whole eggs, do not eat more than 3 egg yolks per week. What foods are recommended? The items listed may not be a complete list. Talk with your dietitian about what dietary choices are best for you. Grains Whole wheat or whole grain breads, crackers, cereals, and pasta. Unsweetened oatmeal. Bulgur. Barley. Quinoa. Brown rice. Whole wheat flour tortillas. Vegetables Fresh or frozen vegetables. Low-sodium canned vegetables. Fruits All fresh, canned (in natural juice), or frozen fruits. Meats and other protein foods Skinless chicken or [place]. Ground chicken or [place]. Lean cuts  of pork, trimmed of fat. Fish and seafood, especially salmon, trout, and herring. Egg whites. Dried beans, peas, or lentils. Unsalted nuts or seeds. Unsalted canned beans. Natural peanut or almond butter. Dairy Low-fat dairy products. Skim or low-fat (1%) milk. Reduced fat (2%) and low-sodium cheese. Low-fat ricotta cheese. Low-fat cottage cheese. Plain, low-fat yogurt. Fats and oils Tub margarine without trans  fats. Light or reduced-fat mayonnaise. Light or reduced-fat salad dressings. Avocado. Safflower, olive, sunflower, soybean, and canola oils. What foods are not recommended? The items listed may not be a complete list. Talk with your dietitian about what dietary choices are best for you. Grains White bread. White (regular) pasta. White rice. Cornbread. Bagels. Pastries. Crackers that contain trans fat. Vegetables Creamed or fried vegetables. Vegetables in a cheese sauce. Fruits Sweetened dried fruit. Canned fruit in syrup. Fruit juice. Meats and other protein foods Fatty cuts of meat. Ribs. Chicken wings. Berniece Salines. Sausage. Bologna. Salami. Chitterlings. Fatback. Hot dogs. Bratwurst. Packaged lunch meats. Dairy Whole or reduced-fat (2%) milk. Half-and-half. Cream cheese. Full-fat or sweetened yogurt. Full-fat cheese. Nondairy creamers. Whipped toppings. Processed cheese or cheese spreads. Cheese curds. Beverages Alcohol. Sweetened drinks, such as soda, lemonade, fruit drinks, or punches. Fats and oils Butter. Stick margarine. Lard. Shortening. Ghee. Bacon fat. Tropical oils, such as coconut, palm kernel, or palm oils. Sweets and desserts Corn syrup. Sugars. Honey. Molasses. Candy. Jam and jelly. Syrup. Sweetened cereals. Cookies. Pies. Cakes. Donuts. Muffins. Ice cream. Condiments Store-bought sauces, dressings, and marinades that are high in sugar, such as ketchup and barbecue sauce. Summary  High levels of triglycerides can increase the risk of heart disease and stroke. Choosing the right foods can help lower your triglycerides.  Eat plenty of fresh fruits, vegetables, and whole grains. Choose low-fat dairy and lean meats. Eat fatty fish at least twice a week.  Avoid processed and prepackaged foods with added sugar, sodium, saturated fat, and trans fat.  If you need suggestions or have questions about what types of food are good for you, talk with your health care provider or a  dietitian. This information is not intended to replace advice given to you by your health care provider. Make sure you discuss any questions you have with your health care provider. Document Released: 07/12/2004 Document Revised: 11/27/2016 Document Reviewed: 11/27/2016 Elsevier Interactive Patient Education  2019 [employer].

## 2018-12-17 ENCOUNTER — Telehealth: Payer: Self-pay | Admitting: Family Medicine

## 2018-12-17 ENCOUNTER — Other Ambulatory Visit: Payer: Self-pay

## 2018-12-17 ENCOUNTER — Other Ambulatory Visit (INDEPENDENT_AMBULATORY_CARE_PROVIDER_SITE_OTHER): Payer: No Typology Code available for payment source

## 2018-12-17 DIAGNOSIS — E781 Pure hyperglyceridemia: Secondary | ICD-10-CM | POA: Diagnosis not present

## 2018-12-17 LAB — LIPID PANEL
CHOL/HDL RATIO: 3
Cholesterol: 166 mg/dL (ref 0–200)
HDL: 48.7 mg/dL (ref >39.00)
NONHDL: 116.85
Triglycerides: 204 mg/dL — ABNORMAL HIGH (ref 0.0–149.0)
VLDL: 40.8 mg/dL — AB (ref 0.0–40.0)

## 2018-12-17 LAB — LDL CHOLESTEROL, DIRECT: LDL DIRECT: 98 mg/dL

## 2018-12-17 NOTE — Telephone Encounter (Signed)
Please inform patient the following information: His triglycerides did come down about 40 points. They are still about 50 point above normal. Recommend he increase his fish oil supplement to 3000 mg a day--> this should lower it into normal range. We will check them again in 6 months- which is when his physical is due.

## 2018-12-18 NOTE — Telephone Encounter (Signed)
Patient advised of lab results and provider recommendation, voiced understanding.

## 2019-06-05 ENCOUNTER — Ambulatory Visit (INDEPENDENT_AMBULATORY_CARE_PROVIDER_SITE_OTHER): Payer: No Typology Code available for payment source | Admitting: Family Medicine

## 2019-06-05 ENCOUNTER — Encounter: Payer: Self-pay | Admitting: Family Medicine

## 2019-06-05 ENCOUNTER — Other Ambulatory Visit: Payer: Self-pay

## 2019-06-05 VITALS — BP 118/75 | HR 58 | Temp 97.3°F | Resp 18 | Ht 74.0 in | Wt 194.0 lb

## 2019-06-05 DIAGNOSIS — M545 Low back pain, unspecified: Secondary | ICD-10-CM

## 2019-06-05 DIAGNOSIS — Z131 Encounter for screening for diabetes mellitus: Secondary | ICD-10-CM

## 2019-06-05 DIAGNOSIS — Z0000 Encounter for general adult medical examination without abnormal findings: Secondary | ICD-10-CM

## 2019-06-05 DIAGNOSIS — I10 Essential (primary) hypertension: Secondary | ICD-10-CM

## 2019-06-05 DIAGNOSIS — K219 Gastro-esophageal reflux disease without esophagitis: Secondary | ICD-10-CM

## 2019-06-05 DIAGNOSIS — G8929 Other chronic pain: Secondary | ICD-10-CM

## 2019-06-05 DIAGNOSIS — E781 Pure hyperglyceridemia: Secondary | ICD-10-CM

## 2019-06-05 DIAGNOSIS — Z1283 Encounter for screening for malignant neoplasm of skin: Secondary | ICD-10-CM

## 2019-06-05 DIAGNOSIS — L989 Disorder of the skin and subcutaneous tissue, unspecified: Secondary | ICD-10-CM | POA: Insufficient documentation

## 2019-06-05 DIAGNOSIS — Z125 Encounter for screening for malignant neoplasm of prostate: Secondary | ICD-10-CM | POA: Diagnosis not present

## 2019-06-05 LAB — LIPID PANEL
Cholesterol: 188 mg/dL (ref 0–200)
HDL: 51.8 mg/dL (ref >39.00)
LDL Cholesterol: 110 mg/dL — ABNORMAL HIGH (ref 0–99)
NonHDL: 136.04
Total CHOL/HDL Ratio: 4
Triglycerides: 129 mg/dL (ref 0.0–149.0)
VLDL: 25.8 mg/dL (ref 0.0–40.0)

## 2019-06-05 LAB — CBC
HCT: 46.7 % (ref 39.0–52.0)
Hemoglobin: 15.8 g/dL (ref 13.0–17.0)
MCHC: 33.8 g/dL (ref 30.0–36.0)
MCV: 91.1 fl (ref 78.0–100.0)
Platelets: 284 10*3/uL (ref 150.0–400.0)
RBC: 5.13 Mil/uL (ref 4.22–5.81)
RDW: 12.5 % (ref 11.5–15.5)
WBC: 4.7 10*3/uL (ref 4.0–10.5)

## 2019-06-05 LAB — COMPREHENSIVE METABOLIC PANEL
ALT: 38 U/L (ref 0–53)
AST: 25 U/L (ref 0–37)
Albumin: 4.5 g/dL (ref 3.5–5.2)
Alkaline Phosphatase: 79 U/L (ref 39–117)
BUN: 18 mg/dL (ref 6–23)
CO2: 29 mEq/L (ref 19–32)
Calcium: 9.6 mg/dL (ref 8.4–10.5)
Chloride: 104 mEq/L (ref 96–112)
Creatinine, Ser: 1.09 mg/dL (ref 0.40–1.50)
GFR: 70.69 mL/min (ref >60.00)
Glucose, Bld: 88 mg/dL (ref 70–99)
Potassium: 4.5 mEq/L (ref 3.5–5.1)
Sodium: 140 mEq/L (ref 135–145)
Total Bilirubin: 0.6 mg/dL (ref 0.2–1.2)
Total Protein: 7.2 g/dL (ref 6.0–8.3)

## 2019-06-05 LAB — HEMOGLOBIN A1C: Hgb A1c MFr Bld: 5.2 % (ref 4.6–6.5)

## 2019-06-05 LAB — TSH: TSH: 1.48 u[IU]/mL (ref 0.35–4.50)

## 2019-06-05 LAB — PSA: PSA: 0.86 ng/mL (ref 0.10–4.00)

## 2019-06-05 MED ORDER — OMEPRAZOLE 20 MG PO CPDR: 20.0000 mg | DELAYED_RELEASE_CAPSULE | Freq: Every day | ORAL | 3 refills | Status: DC

## 2019-06-05 MED ORDER — AMLODIPINE BESYLATE 2.5 MG PO TABS: 2.5000 mg | ORAL_TABLET | Freq: Every day | ORAL | 1 refills | Status: DC

## 2019-06-05 NOTE — Progress Notes (Signed)
Patient ID: Micheal Lawson, male  DOB: 1966-09-23, 53 y.o.   MRN: [id number] Patient Care Team    Relationship Specialty Notifications Start End  Ma Hillock, DO PCP - General Family Medicine  06/16/18   Milus Banister, MD Attending Physician Gastroenterology  06/18/18   Audie Box, MD Referring Physician Ophthalmology  06/18/18     Chief Complaint  Patient presents with  . Annual Exam    Fasting. Will get flu shot at work. Needs Refills on medications.     Subjective:  Micheal Lawson is a 53 y.o.  Male  present for CPE. All past medical history, surgical history, allergies, family history, immunizations, medications and social history were updated  in the electronic medical record today. All recent labs, ED visits and hospitalizations within the last year were reviewed.  Health maintenance:  Colonoscopy: completed 06/2016, by Dr. Ardis Hughs- hyperplastic polyp x1. follow up 10 years (2027). Immunizations: tdap UTD 2018, Influenza UTD gets at work (encouraged yearly), shingrix series completed Infectious disease screening: HIV completed 2017 PSA: fhx in father (79) PSA yearly recommended. Last PSA 2019 NL. Assistive device: none Oxygen [id number] Patient has a Dental home. Hospitalizations/ED visits: reviewed  Gastroesophageal reflux disease without esophagitis Use PPI a few times a week. stable. No complaints   Essential hypertension/hypertrig Pt reports compliance with amlodipine. Patient denies chest pain, shortness of breath, dizziness or lower extremity edema.  Marland Kitchen He did start fish oil 1000 mg.  BMP: 06/2018 CBC: 06/2018 Lipids: 06/2018- elevated triglycerides--> start fish oil and exercise routinely. TSH: 06/2018 Exercise: routinely exercises. RF: HTN, FHX, hypertrig  Skin lesions: he has 3 lesions he has concerns about.  Lumbago: chronic. Uses chiropractor a few times a year. Lower lumbar midline pain.   Depression screen [facility] 2/9 06/05/2019  06/18/2018  Decreased Interest 0 0  Down, Depressed, Hopeless 0 0  PHQ - 2 Score 0 0   No flowsheet data found.    Immunization History  Administered Date(s) Administered  . Influenza Whole 12/27/2008  . Influenza-Unspecified 07/03/2017  . Td 05/08/2001, 12/27/2008  . Tdap 03/30/2017  . Zoster Recombinat (Shingrix) 06/18/2018, 09/17/2018    Past Medical History:  Diagnosis Date  . ALLERGIC RHINITIS 05/08/2007   Qualifier: Diagnosis of  By: Marca Ancona RMA, [name]    . Frequent headaches   . GLAUCOMA 04/01/2009   DR. Bryan at [facility]  . History of colon polyps    hyperplastic - Dr. Ardis Hughs  . Hypertension    No Known Allergies Past Surgical History:  Procedure Laterality Date  . CYST REMOVAL NECK    . ELECTROCARDIOGRAM  04/04/2007  . SHOULDER ARTHROSCOPY WITH ROTATOR CUFF REPAIR AND SUBACROMIAL DECOMPRESSION  09/26/2012   Procedure: SHOULDER ARTHROSCOPY WITH ROTATOR CUFF REPAIR AND SUBACROMIAL DECOMPRESSION;  Surgeon: Yvette Rack., MD;  Location: [place];  Service: Orthopedics;  Laterality: Right;  RIGHT SHOULDER ARTHROSCOPY WITH EXTENSIVE DEBRIDEMENT, SUBACROMIAL DECOMPRESSION, PARTIAL ACROMIOPLASTY WITH CORACROMIAL RELEASE   Family History  Problem Relation Age of Onset  . Leukemia Sister   . Heart disease Father        CABG 55  . Hypertension Father   . Hyperlipidemia Father   . Prostate cancer Father 29  . Colon cancer Maternal Aunt   . Arthritis Mother   . Hyperlipidemia Mother   . Hypertension Mother   . Arthritis Maternal Grandmother   . Breast cancer Maternal Grandmother   . Stroke Maternal Grandmother   .  Arthritis Maternal Grandfather   . Arthritis Paternal Grandmother   . Arthritis Paternal Grandfather   . Heart disease Paternal Grandfather    Social History   Social History Narrative   Marital status/children/pets: Married   Education/employment: B.S., Network engineer for [employer]   Safety:      -Wears a bicycle helmet riding a bike: Yes     -smoke  alarm in the home:Yes     - wears seatbelt: Yes     - Feels safe in their relationships: Yes    Allergies as of 06/05/2019   No Known Allergies     Medication List       Accurate as of June 05, 2019  9:10 AM. If you have any questions, ask your nurse or doctor.        STOP taking these medications   cyclobenzaprine 5 MG tablet Commonly known as: FLEXERIL Stopped by: Howard Pouch, DO     TAKE these medications   amLODipine 2.5 MG tablet Commonly known as: NORVASC Take 1 tablet (2.5 mg total) by mouth daily.   aspirin EC 81 MG tablet Take 81 mg by mouth daily.   cetirizine 10 MG tablet Commonly known as: ZYRTEC Take 10 mg by mouth daily.   dorzolamide-timolol 22.3-6.8 MG/ML ophthalmic solution Commonly known as: COSOPT Place 1 drop into both eyes 2 (two) times daily.   Fish Oil 1000 MG Caps Take 3 capsules by mouth daily.   multivitamin tablet Take 1 tablet by mouth daily.   omeprazole 20 MG capsule Commonly known as: PRILOSEC Take 1 capsule (20 mg total) by mouth daily.   Travoprost (BAK Free) 0.004 % Soln ophthalmic solution Commonly known as: TRAVATAN Place 1 drop into both eyes daily.       All past medical history, surgical history, allergies, family history, immunizations andmedications were updated in the EMR today and reviewed under the history and medication portions of their EMR.     No results found for this or any previous visit (from the past 2160 hour(s)).  ROS: 14 pt review of systems performed and negative (unless mentioned in an HPI)  Objective: BP 118/75 (BP Location: Right Arm, Patient Position: Sitting, Cuff Size: Normal)   Pulse (!) 58   Temp (!) 97.3 F (36.3 C) (Temporal)   Resp 18   Ht 6\' 2"  (1.88 m)   Wt 194 lb (88 kg)   SpO2 98%   BMI 24.91 kg/m  Gen: Afebrile. No acute distress. Nontoxic in appearance, well-developed, well-nourished,  Pleasant Male HENT: AT. [place]. Bilateral TM visualized and normal in appearance, normal  external auditory canal. MMM, no oral lesions, adequate dentition. Bilateral nares within normal limits. Throat without erythema, ulcerations or exudates. no Cough on exam, no hoarseness on exam. Eyes:Pupils Equal Round Reactive to light, Extraocular movements intact,  Conjunctiva without redness, discharge or icterus. Neck/lymp/endocrine: Supple,no lymphadenopathy, no thyromegaly CV: RRR no murmur, no edema, +2/4 P posterior tibialis pulses. no carotid bruits. No JVD. Chest: CTAB, no wheeze, rhonchi or crackles. normal Respiratory effort. good Air movement. Abd: Soft. flat. NTND. BS present. no Masses palpated. No hepatosplenomegaly. No rebound tenderness or guarding. Skin: no rashes, purpura or petechiae. Warm and well-perfused. Skin intact. X1 raised flexh tone < 32mm skin lesion scalp, 70mm warty skin lesion medial right knee, skin tag low lumbar midline.  Neuro/Msk:  Normal gait. PERLA. EOMi. Alert. Oriented x3.  Cranial nerves II through XII intact. Muscle strength 5/5 upper/lower extremity. DTRs equal bilaterally. Psych: Normal affect,  dress and demeanor. Normal speech. Normal thought content and judgment.  No exam data present  Assessment/plan: Micheal Lawson is a 53 y.o. male present for CPE Encounter for preventive health examination Patient was encouraged to exercise greater than 150 minutes a week. Patient was encouraged to choose a diet filled with fresh fruits and vegetables, and lean meats. AVS provided to patient today for education/recommendation on gender specific health and safety maintenance. Colonoscopy: completed 06/2016, by Dr. Ardis Hughs- hyperplastic polyp x1. follow up 10 years (2027). Immunizations: tdap UTD 2018, Influenza UTD gets at work (encouraged yearly), shingrix series completed Infectious disease screening: HIV completed 2017 PSA: fhx in father (79) PSA yearly recommended. Last PSA 2019 NL. Collected today  Gastroesophageal reflux disease without esophagitis  Stable. Still using a few times a week.  Continue omeprazole 20 mg QD PRN .refiled   Essential hypertension/hypertrig - Stable.  - Monitor BP at home, if > 135/85 routinely then will need to increase amlodipine dose. . - continue amlodipine 2.5 mg Q - continue  fish oil supplement 3000 mg -If still elevated tg will need to consider fenofibrate.   - f/u 6 months.   Lumbago: He has been seeing chiro for a many years for alignment for chronc lumbago. He would like to establish with Dr. Tamala Julian for OMT when needed. Referral placed today.   Skin lesions:  - x3 lesions (scalp- freeze, knee freeze and shave, lower back tag). Procedure appt for removal.  - referral to derm for skin checks (already established and goes yearly)  Annual CPE and additional > 15 minutes spent with patient, > 50% of that time face to face managing acute and chronic complaints.   Return in about 1 year (around 06/04/2020) for CPE (30 min). 6 months [place] Procedure appt for skin lesion removal x3  Electronically signed by: Howard Pouch, DO [facility]

## 2019-06-05 NOTE — Patient Instructions (Signed)

## 2019-06-18 ENCOUNTER — Other Ambulatory Visit: Payer: Self-pay

## 2019-06-18 DIAGNOSIS — Z20822 Contact with and (suspected) exposure to covid-19: Secondary | ICD-10-CM

## 2019-06-19 LAB — NOVEL CORONAVIRUS, NAA: SARS-CoV-2, NAA: NOT DETECTED

## 2019-06-24 ENCOUNTER — Encounter: Payer: Self-pay | Admitting: Family Medicine

## 2019-07-10 ENCOUNTER — Other Ambulatory Visit: Payer: Self-pay

## 2019-07-10 ENCOUNTER — Encounter: Payer: Self-pay | Admitting: Family Medicine

## 2019-07-10 ENCOUNTER — Ambulatory Visit (INDEPENDENT_AMBULATORY_CARE_PROVIDER_SITE_OTHER): Payer: No Typology Code available for payment source | Admitting: Family Medicine

## 2019-07-10 VITALS — BP 129/86 | HR 70 | Temp 97.7°F | Ht 74.0 in | Wt 193.4 lb

## 2019-07-10 DIAGNOSIS — L821 Other seborrheic keratosis: Secondary | ICD-10-CM | POA: Diagnosis not present

## 2019-07-10 DIAGNOSIS — L989 Disorder of the skin and subcutaneous tissue, unspecified: Secondary | ICD-10-CM

## 2019-07-10 DIAGNOSIS — L918 Other hypertrophic disorders of the skin: Secondary | ICD-10-CM | POA: Diagnosis not present

## 2019-07-10 NOTE — Progress Notes (Signed)
Micheal Lawson , [birth year], 53 y.o., male MRN: [id number] Patient Care Team    Relationship Specialty Notifications Start End  Ma Hillock, DO PCP - General Family Medicine  06/16/18   Milus Banister, MD Attending Physician Gastroenterology  06/18/18   Audie Box, MD Referring Physician Ophthalmology  06/18/18   Druscilla Brownie, MD Referring Physician Dermatology  06/05/19     Chief Complaint  Patient presents with  . Follow-up    procedure. skin tag on back, right knee and left thigh      Subjective: Pt presents for an OV with complaints of x3 skin lesions right knee, midline back and left posterior thigh that are causing him irritation and catching on clothing.  He has no personal or family h/o skin cancer.   Depression screen [facility] 2/9 06/05/2019 06/18/2018  Decreased Interest 0 0  Down, Depressed, Hopeless 0 0  PHQ - 2 Score 0 0    No Known Allergies Social History   Social History Narrative   Marital status/children/pets: Married   Education/employment: B.S., Network engineer for [employer]   Safety:      -Wears a bicycle helmet riding a bike: Yes     -smoke alarm in the home:Yes     - wears seatbelt: Yes     - Feels safe in their relationships: Yes   Past Medical History:  Diagnosis Date  . ALLERGIC RHINITIS 05/08/2007   Qualifier: Diagnosis of  By: Marca Ancona RMA, [name]    . Frequent headaches   . GLAUCOMA 04/01/2009   DR. Bryan at [facility]  . History of colon polyps    hyperplastic - Dr. Ardis Hughs  . Hypertension    Past Surgical History:  Procedure Laterality Date  . CYST REMOVAL NECK    . ELECTROCARDIOGRAM  04/04/2007  . SHOULDER ARTHROSCOPY WITH ROTATOR CUFF REPAIR AND SUBACROMIAL DECOMPRESSION  09/26/2012   Procedure: SHOULDER ARTHROSCOPY WITH ROTATOR CUFF REPAIR AND SUBACROMIAL DECOMPRESSION;  Surgeon: Yvette Rack., MD;  Location: [facility];  Service: Orthopedics;  Laterality: Right;  RIGHT SHOULDER ARTHROSCOPY WITH EXTENSIVE DEBRIDEMENT,  SUBACROMIAL DECOMPRESSION, PARTIAL ACROMIOPLASTY WITH CORACROMIAL RELEASE   Family History  Problem Relation Age of Onset  . Leukemia Sister   . Heart disease Father        CABG 4  . Hypertension Father   . Hyperlipidemia Father   . Prostate cancer Father 22  . Colon cancer Maternal Aunt   . Arthritis Mother   . Hyperlipidemia Mother   . Hypertension Mother   . Arthritis Maternal Grandmother   . Breast cancer Maternal Grandmother   . Stroke Maternal Grandmother   . Arthritis Maternal Grandfather   . Arthritis Paternal Grandmother   . Arthritis Paternal Grandfather   . Heart disease Paternal Grandfather    Allergies as of 07/10/2019   No Known Allergies     Medication List       Accurate as of July 10, 2019  8:37 AM. If you have any questions, ask your nurse or doctor.        amLODipine 2.5 MG tablet Commonly known as: NORVASC Take 1 tablet (2.5 mg total) by mouth daily.   aspirin EC 81 MG tablet Take 81 mg by mouth daily.   cetirizine 10 MG tablet Commonly known as: ZYRTEC Take 10 mg by mouth daily.   dorzolamide-timolol 22.3-6.8 MG/ML ophthalmic solution Commonly known as: COSOPT Place 1 drop into both eyes 2 (two) times daily.  Fish Oil 1000 MG Caps Take 3 capsules by mouth daily.   multivitamin tablet Take 1 tablet by mouth daily.   omeprazole 20 MG capsule Commonly known as: PRILOSEC Take 1 capsule (20 mg total) by mouth daily.   Travoprost (BAK Free) 0.004 % Soln ophthalmic solution Commonly known as: TRAVATAN Place 1 drop into both eyes daily.       All past medical history, surgical history, allergies, family history, immunizations andmedications were updated in the EMR today and reviewed under the history and medication portions of their EMR.     ROS: Negative, with the exception of above mentioned in HPI   Objective:  BP 129/86 (BP Location: Right Arm, Patient Position: Sitting, Cuff Size: Normal)   Pulse 70   Temp 97.7 F (36.5 C)  (Temporal)   Ht 6\' 2"  (1.88 m)   Wt 193 lb 6 oz (87.7 kg)   SpO2 96%   BMI 24.83 kg/m  Body mass index is 24.83 kg/m. Gen: Afebrile. No acute distress. Nontoxic in appearance, well developed, well nourished.  HENT: AT. .  Skin: x1 < 0.5 cm skin tag lower mid lumbar back, x1 warty lesion 1cm x 0.5 cm right medial knee, x1 3cm x 2 cm flaky brown stuck on lesion left posterior mid thigh.  Neuro: Normal gait. PERLA. EOMi. Alert. Oriented x3   No exam data present No results found. No results found for this or any previous visit (from the past 24 hour(s)).  Assessment/Plan: Micheal Lawson is a 53 y.o. male present for OV for  Skin lesion of right leg/Seborrheic keratosis/Skin tag, back The patient was consented for biopsy. The complications, instructions as to how the procedure will be performed, and postoperative instructions were given to the patient.  Intent: To remove a portion of skin so that it can be examined histologically. Location: x1 < 0.5 cm skin tag lower mid lumbar back, x1 warty lesion 1cm x 0.5 cm right medial knee, x1 3cm x 2 cm flaky brown stuck on lesion left posterior mid thigh.  PROCEDURE: The site was cleaned with antiseptic. 0.5cc of  local anesthetic (1% lidocaine plain) injected surrounding site at right knee and lumbar spine. Left posterior thigh lesion cryofreeze application for dustruction benign lesion. Right knee and lumbar back both were removed via shave biopsy. The site was then checked for bleeding. Silver nitrate was used x1 on right knee.  Once hemostasis was achieved, a local antibiotic ointment was placed and a pressure dressing applied. The patient was further instructed to keep the site completely dry for the next 24 hours, after which a new dressing and antibiotic ointment should be applied to the area. They were further instructed to avoid getting the site dirty or infected. The patient completed the procedure without any complications and tolerated  the the procedure well.  The biopsy will not be sent for analysis.   Reviewed expectations re: course of current medical issues.  Discussed self-management of symptoms.  Outlined signs and symptoms indicating need for more acute intervention.  Patient verbalized understanding and all questions were answered.  Patient received an After-Visit Summary.    No orders of the defined types were placed in this encounter.  > 25 minutes spent with patient, >50% of time spent face to face     Note is dictated utilizing voice recognition software. Although note has been proof read prior to signing, occasional typographical errors still can be missed. If any questions arise, please do not hesitate to  call for verification.   electronically signed by:  Howard Pouch, DO  [place]

## 2019-07-10 NOTE — Patient Instructions (Signed)
Leave pressure dressing and current bandaids in place for 24 hours. Keep areas clean and dry. If working outside keep covered with band-aid and neosporin.  Monitor for any signs of infection (redness, drainage, swelling)>> be seen if occurs.     Wound Care, Adult Taking care of your wound properly can help to prevent pain, infection, and scarring. It can also help your wound to heal more quickly. How to care for your wound Wound care      Follow instructions from your health care provider about how to take care of your wound. Make sure you: ? Wash your hands with soap and water before you change the bandage (dressing). If soap and water are not available, use hand sanitizer. ? Change your dressing as told by your health care provider. ? Leave stitches (sutures), skin glue, or adhesive strips in place. These skin closures may need to stay in place for 2 weeks or longer. If adhesive strip edges start to loosen and curl up, you may trim the loose edges. Do not remove adhesive strips completely unless your health care provider tells you to do that.  Check your wound area every day for signs of infection. Check for: ? Redness, swelling, or pain. ? Fluid or blood. ? Warmth. ? Pus or a bad smell.  Ask your health care provider if you should clean the wound with mild soap and water. Doing this may include: ? Using a clean towel to pat the wound dry after cleaning it. Do not rub or scrub the wound. ? Applying a cream or ointment. Do this only as told by your health care provider. ? Covering the incision with a clean dressing.  Ask your health care provider when you can leave the wound uncovered.  Keep the dressing dry until your health care provider says it can be removed. Do not take baths, swim, use a hot tub, or do anything that would put the wound underwater until your health care provider approves. Ask your health care provider if you can take showers. You may only be allowed to take  sponge baths. Medicines   If you were prescribed an antibiotic medicine, cream, or ointment, take or use the antibiotic as told by your health care provider. Do not stop taking or using the antibiotic even if your condition improves.  Take over-the-counter and prescription medicines only as told by your health care provider. If you were prescribed pain medicine, take it 30 or more minutes before you do any wound care or as told by your health care provider. General instructions  Return to your normal activities as told by your health care provider. Ask your health care provider what activities are safe.  Do not scratch or pick at the wound.  Do not use any products that contain nicotine or tobacco, such as cigarettes and e-cigarettes. These may delay wound healing. If you need help quitting, ask your health care provider.  Keep all follow-up visits as told by your health care provider. This is important.  Eat a diet that includes protein, vitamin A, vitamin C, and other nutrient-rich foods to help the wound heal. ? Foods rich in protein include meat, dairy, beans, nuts, and other sources. ? Foods rich in vitamin A include carrots and dark green, leafy vegetables. ? Foods rich in vitamin C include citrus, tomatoes, and other fruits and vegetables. ? Nutrient-rich foods have protein, carbohydrates, fat, vitamins, or minerals. Eat a variety of healthy foods including vegetables, fruits, and whole grains. Contact  a health care provider if:  You received a tetanus shot and you have swelling, severe pain, redness, or bleeding at the injection site.  Your pain is not controlled with medicine.  You have redness, swelling, or pain around the wound.  You have fluid or blood coming from the wound.  Your wound feels warm to the touch.  You have pus or a bad smell coming from the wound.  You have a fever or chills.  You are nauseous or you vomit.  You are dizzy. Get help right away if:   You have a red streak going away from your wound.  The edges of the wound open up and separate.  Your wound is bleeding, and the bleeding does not stop with gentle pressure.  You have a rash.  You faint.  You have trouble breathing. Summary  Always wash your hands with soap and water before changing your bandage (dressing).  To help with healing, eat foods that are rich in protein, vitamin A, vitamin C, and other nutrients.  Check your wound every day for signs of infection. Contact your health care provider if you suspect that your wound is infected. This information is not intended to replace advice given to you by your health care provider. Make sure you discuss any questions you have with your health care provider. Document Released: 07/03/2008 Document Revised: 01/12/2019 Document Reviewed: 04/10/2016 Elsevier Patient Education  2020 [employer].

## 2019-07-27 ENCOUNTER — Encounter: Payer: Self-pay | Admitting: Family Medicine

## 2019-10-09 DIAGNOSIS — U071 COVID-19: Secondary | ICD-10-CM

## 2019-10-09 HISTORY — DX: COVID-19: U07.1

## 2019-11-05 ENCOUNTER — Ambulatory Visit: Payer: No Typology Code available for payment source | Attending: Internal Medicine

## 2019-11-05 DIAGNOSIS — Z20822 Contact with and (suspected) exposure to covid-19: Secondary | ICD-10-CM

## 2019-11-06 LAB — NOVEL CORONAVIRUS, NAA: SARS-CoV-2, NAA: DETECTED — AB

## 2019-11-06 NOTE — Progress Notes (Signed)
Your test for COVID-19 was positive ("detected"), meaning that you were infected with the novel coronavirus and could give the germ to others.    Please continue isolation at home, for at least 10 days since the start of your fever/cough/breathlessness and until you have had 24 hours without fever (without taking a fever reducer) and with any cough/breathlessness improving. Use over-the-counter medications for symptoms.  If you have had no symptoms, but were exposed to someone who was positive for COVID-19, you will need to quarantine and self-isolate for 14 days from the date of exposure.    Please continue good preventive care measures, including:  frequent hand-washing, avoid touching your face, cover coughs/sneezes, stay out of crowds and keep a 6 foot distance from others.  Clean hard surfaces touched frequently with disinfectant cleaning products.   Please check in with your primary care provider about your positive test result.  Go to the nearest urgent care or ED for assessment if you have severe breathlessness or severe weakness/fatigue (ex needing new help getting out of bed or to the bathroom).  Members of your household will also need to quarantine for 14 days from the date of your positive test. You may be contacted to discuss possible treatment options, and you may also be contacted by the health department for follow up. Please call [place] at [phone] if you have any questions or concerns.

## 2019-11-13 ENCOUNTER — Encounter: Payer: Self-pay | Admitting: Family Medicine

## 2019-11-17 DIAGNOSIS — L570 Actinic keratosis: Secondary | ICD-10-CM | POA: Diagnosis not present

## 2019-11-17 DIAGNOSIS — D225 Melanocytic nevi of trunk: Secondary | ICD-10-CM | POA: Diagnosis not present

## 2019-11-17 DIAGNOSIS — L814 Other melanin hyperpigmentation: Secondary | ICD-10-CM | POA: Diagnosis not present

## 2019-11-17 DIAGNOSIS — D1801 Hemangioma of skin and subcutaneous tissue: Secondary | ICD-10-CM | POA: Diagnosis not present

## 2019-11-17 DIAGNOSIS — L821 Other seborrheic keratosis: Secondary | ICD-10-CM | POA: Diagnosis not present

## 2019-11-17 DIAGNOSIS — D485 Neoplasm of uncertain behavior of skin: Secondary | ICD-10-CM | POA: Diagnosis not present

## 2019-11-17 DIAGNOSIS — D229 Melanocytic nevi, unspecified: Secondary | ICD-10-CM | POA: Diagnosis not present

## 2019-11-17 NOTE — Telephone Encounter (Signed)
error 

## 2019-12-04 ENCOUNTER — Ambulatory Visit (INDEPENDENT_AMBULATORY_CARE_PROVIDER_SITE_OTHER): Payer: No Typology Code available for payment source | Admitting: Family Medicine

## 2019-12-04 ENCOUNTER — Other Ambulatory Visit: Payer: Self-pay

## 2019-12-04 ENCOUNTER — Encounter: Payer: Self-pay | Admitting: Family Medicine

## 2019-12-04 VITALS — BP 131/90 | HR 57 | Temp 97.3°F | Resp 17 | Ht 74.0 in | Wt 193.2 lb

## 2019-12-04 DIAGNOSIS — E781 Pure hyperglyceridemia: Secondary | ICD-10-CM | POA: Diagnosis not present

## 2019-12-04 DIAGNOSIS — I10 Essential (primary) hypertension: Secondary | ICD-10-CM

## 2019-12-04 DIAGNOSIS — K219 Gastro-esophageal reflux disease without esophagitis: Secondary | ICD-10-CM | POA: Diagnosis not present

## 2019-12-04 MED ORDER — AMLODIPINE BESYLATE 2.5 MG PO TABS: 2.5000 mg | ORAL_TABLET | Freq: Every day | ORAL | 1 refills | Status: DC

## 2019-12-04 MED ORDER — OMEPRAZOLE 20 MG PO CPDR: 20.0000 mg | DELAYED_RELEASE_CAPSULE | Freq: Every day | ORAL | 3 refills | Status: DC

## 2019-12-04 NOTE — Progress Notes (Signed)
Patient ID: Micheal Lawson, male  DOB: 1966-07-05, 54 y.o.   MRN: [id number] Patient Care Team    Relationship Specialty Notifications Start End  Ma Hillock, DO PCP - General Family Medicine  06/16/18   Milus Banister, MD Attending Physician Gastroenterology  06/18/18   Audie Box, MD Referring Physician Ophthalmology  06/18/18   Druscilla Brownie, MD Referring Physician Dermatology  06/05/19     Chief Complaint  Patient presents with  . Hypertension    Pt takes BP at home, running WNL. Takes BP meds in the AM, took at 0745 this AM. Needs refills     Subjective:  Micheal Lawson is a 54 y.o.  Male  present for [facility] Gastroesophageal reflux disease without esophagitis Use PPI daily now. He had a couple flares since last visit and started daily- now controlled. Does not need refills today  Essential hypertension/hypertrig Pt reports complaince with amlodipine. Patient denies chest pain, shortness of breath, dizziness or lower extremity edema.  He did start fish oil 1000 mg.  Labs UTD 06/2019. Exercise: routinely exercises. RF: HTN, FHX, hypertrig  Depression screen [facility] 2/9 06/05/2019 06/18/2018  Decreased Interest 0 0  Down, Depressed, Hopeless 0 0  PHQ - 2 Score 0 0   No flowsheet data found.  Immunization History  Administered Date(s) Administered  . Influenza Whole 12/27/2008  . Influenza-Unspecified 07/03/2017  . PFIZER SARS-COV-2 Vaccination 10/23/2019, 11/13/2019  . Td 05/08/2001, 12/27/2008  . Tdap 03/30/2017  . Zoster Recombinat (Shingrix) 06/18/2018, 09/17/2018    Past Medical History:  Diagnosis Date  . ALLERGIC RHINITIS 05/08/2007   Qualifier: Diagnosis of  By: Marca Ancona RMA, [name]    . COVID-19 virus infection 10/2019  . Frequent headaches   . GLAUCOMA 04/01/2009   DR. Bryan at [facility]  . History of colon polyps    hyperplastic - Dr. Ardis Hughs  . Hypertension    No Known Allergies Past Surgical History:  Procedure  Laterality Date  . CYST REMOVAL NECK    . ELECTROCARDIOGRAM  04/04/2007  . SHOULDER ARTHROSCOPY WITH ROTATOR CUFF REPAIR AND SUBACROMIAL DECOMPRESSION  09/26/2012   Procedure: SHOULDER ARTHROSCOPY WITH ROTATOR CUFF REPAIR AND SUBACROMIAL DECOMPRESSION;  Surgeon: Yvette Rack., MD;  Location: [facility] [facility];  Service: Orthopedics;  Laterality: Right;  RIGHT SHOULDER ARTHROSCOPY WITH EXTENSIVE DEBRIDEMENT, SUBACROMIAL DECOMPRESSION, PARTIAL ACROMIOPLASTY WITH CORACROMIAL RELEASE   Family History  Problem Relation Age of Onset  . Leukemia Sister   . Heart disease Father        CABG 36  . Hypertension Father   . Hyperlipidemia Father   . Prostate cancer Father 49  . Colon cancer Maternal Aunt   . Arthritis Mother   . Hyperlipidemia Mother   . Hypertension Mother   . Arthritis Maternal Grandmother   . Breast cancer Maternal Grandmother   . Stroke Maternal Grandmother   . Arthritis Maternal Grandfather   . Arthritis Paternal Grandmother   . Arthritis Paternal Grandfather   . Heart disease Paternal Grandfather    Social History   Social History Narrative   Marital status/children/pets: Married   Education/employment: B.S., Network engineer for [employer]   Safety:      -Wears a bicycle helmet riding a bike: Yes     -smoke alarm in the home:Yes     - wears seatbelt: Yes     - Feels safe in their relationships: Yes    Allergies as of 12/04/2019   No Known  Allergies     Medication List       Accurate as of December 04, 2019  8:46 AM. If you have any questions, ask your nurse or doctor.        STOP taking these medications   aspirin EC 81 MG tablet Stopped by: Howard Pouch, DO     TAKE these medications   amLODipine 2.5 MG tablet Commonly known as: NORVASC Take 1 tablet (2.5 mg total) by mouth daily.   cetirizine 10 MG tablet Commonly known as: ZYRTEC Take 10 mg by mouth daily.   dorzolamide-timolol 22.3-6.8 MG/ML ophthalmic solution Commonly known as: COSOPT Place 1 drop into both  eyes 2 (two) times daily.   Fish Oil 1000 MG Caps Take 3 capsules by mouth daily.   multivitamin tablet Take 1 tablet by mouth daily.   omeprazole 20 MG capsule Commonly known as: PRILOSEC Take 1 capsule (20 mg total) by mouth daily.   Travoprost (BAK Free) 0.004 % Soln ophthalmic solution Commonly known as: TRAVATAN Place 1 drop into both eyes daily.       All past medical history, surgical history, allergies, family history, immunizations andmedications were updated in the EMR today and reviewed under the history and medication portions of their EMR.      ROS: 14 pt review of systems performed and negative (unless mentioned in an HPI)  Objective: BP 131/90 (BP Location: Right Arm, Patient Position: Sitting, Cuff Size: Normal)   Pulse (!) 57   Temp (!) 97.3 F (36.3 C) (Temporal)   Resp 17   Ht 6\' 2"  (1.88 m)   Wt 193 lb 4 oz (87.7 kg)   SpO2 96%   BMI 24.81 kg/m  Gen: Afebrile. No acute distress. Nontoxic in appearance, well-developed, pleasant caucasian male.  HENT: AT. [place].   Eyes:Pupils Equal Round Reactive to light, Extraocular movements intact,  Conjunctiva without redness, discharge or icterus. CV: RRR no murmur, no edema, +2/4 P posterior tibialis pulses Chest: CTAB, no wheeze or crackles Neuro: Normal gait. PERLA. EOMi. Alert. Oriented x3.   No exam data present  Assessment/plan: Micheal Lawson is a 54 y.o. male present for [facility] Gastroesophageal reflux disease without esophagitis Stable. Continue omeprazole 20 mg QD   Essential hypertension/hypertrig - Stable. - Monitor BP at home, if > 135/85 routinely then will need to increase amlodipine dose.  - continue amlodipine 2.5 mg Q - continue  fish oil supplement 3000 mg\ - Has appt set for 06/2020 for CPE  No orders of the defined types were placed in this encounter.  Meds ordered this encounter  Medications  . amLODipine (NORVASC) 2.5 MG tablet    Sig: Take 1 tablet (2.5 mg total) by mouth  daily.    Dispense:  90 tablet    Refill:  1  . omeprazole (PRILOSEC) 20 MG capsule    Sig: Take 1 capsule (20 mg total) by mouth daily.    Dispense:  90 capsule    Refill:  3   Referral Orders  No referral(s) requested today      Electronically signed by: Howard Pouch, [place]

## 2019-12-04 NOTE — Patient Instructions (Signed)
Great to see you today.  I have refilled your medications.   Next follow up is your CPE which I see is already schedule for September- unless you need to see me sooner... Have a great Summer.

## 2020-03-08 ENCOUNTER — Other Ambulatory Visit (HOSPITAL_COMMUNITY): Payer: Self-pay | Admitting: Optometrist

## 2020-03-08 DIAGNOSIS — Z135 Encounter for screening for eye and ear disorders: Secondary | ICD-10-CM | POA: Diagnosis not present

## 2020-03-08 DIAGNOSIS — H524 Presbyopia: Secondary | ICD-10-CM | POA: Diagnosis not present

## 2020-03-08 DIAGNOSIS — H401122 Primary open-angle glaucoma, left eye, moderate stage: Secondary | ICD-10-CM | POA: Diagnosis not present

## 2020-03-08 DIAGNOSIS — H401111 Primary open-angle glaucoma, right eye, mild stage: Secondary | ICD-10-CM | POA: Diagnosis not present

## 2020-03-08 DIAGNOSIS — H5213 Myopia, bilateral: Secondary | ICD-10-CM | POA: Diagnosis not present

## 2020-06-10 ENCOUNTER — Ambulatory Visit (INDEPENDENT_AMBULATORY_CARE_PROVIDER_SITE_OTHER): Payer: 59 | Admitting: Family Medicine

## 2020-06-10 ENCOUNTER — Other Ambulatory Visit: Payer: Self-pay | Admitting: Family Medicine

## 2020-06-10 ENCOUNTER — Encounter: Payer: Self-pay | Admitting: Family Medicine

## 2020-06-10 ENCOUNTER — Other Ambulatory Visit: Payer: Self-pay

## 2020-06-10 VITALS — BP 129/88 | HR 58 | Temp 98.2°F | Resp 16 | Ht 72.64 in | Wt 194.2 lb

## 2020-06-10 DIAGNOSIS — Z125 Encounter for screening for malignant neoplasm of prostate: Secondary | ICD-10-CM | POA: Diagnosis not present

## 2020-06-10 DIAGNOSIS — I10 Essential (primary) hypertension: Secondary | ICD-10-CM

## 2020-06-10 DIAGNOSIS — E781 Pure hyperglyceridemia: Secondary | ICD-10-CM | POA: Diagnosis not present

## 2020-06-10 DIAGNOSIS — Z1159 Encounter for screening for other viral diseases: Secondary | ICD-10-CM | POA: Diagnosis not present

## 2020-06-10 DIAGNOSIS — Z131 Encounter for screening for diabetes mellitus: Secondary | ICD-10-CM

## 2020-06-10 DIAGNOSIS — Z0000 Encounter for general adult medical examination without abnormal findings: Secondary | ICD-10-CM | POA: Diagnosis not present

## 2020-06-10 DIAGNOSIS — K219 Gastro-esophageal reflux disease without esophagitis: Secondary | ICD-10-CM

## 2020-06-10 DIAGNOSIS — Z23 Encounter for immunization: Secondary | ICD-10-CM

## 2020-06-10 LAB — PSA: PSA: 0.78 ng/mL (ref 0.10–4.00)

## 2020-06-10 LAB — CBC
HCT: 44.9 % (ref 39.0–52.0)
Hemoglobin: 15.5 g/dL (ref 13.0–17.0)
MCHC: 34.5 g/dL (ref 30.0–36.0)
MCV: 91.6 fl (ref 78.0–100.0)
Platelets: 299 10*3/uL (ref 150.0–400.0)
RBC: 4.91 Mil/uL (ref 4.22–5.81)
RDW: 12.3 % (ref 11.5–15.5)
WBC: 4.6 10*3/uL (ref 4.0–10.5)

## 2020-06-10 LAB — COMPREHENSIVE METABOLIC PANEL
ALT: 36 U/L (ref 0–53)
AST: 21 U/L (ref 0–37)
Albumin: 4.4 g/dL (ref 3.5–5.2)
Alkaline Phosphatase: 67 U/L (ref 39–117)
BUN: 16 mg/dL (ref 6–23)
CO2: 27 mEq/L (ref 19–32)
Calcium: 9.4 mg/dL (ref 8.4–10.5)
Chloride: 103 mEq/L (ref 96–112)
Creatinine, Ser: 1 mg/dL (ref 0.40–1.50)
GFR: 77.78 mL/min (ref >60.00)
Glucose, Bld: 95 mg/dL (ref 70–99)
Potassium: 4.1 mEq/L (ref 3.5–5.1)
Sodium: 137 mEq/L (ref 135–145)
Total Bilirubin: 0.9 mg/dL (ref 0.2–1.2)
Total Protein: 6.8 g/dL (ref 6.0–8.3)

## 2020-06-10 LAB — TSH: TSH: 3.23 u[IU]/mL (ref 0.35–4.50)

## 2020-06-10 LAB — HEMOGLOBIN A1C: Hgb A1c MFr Bld: 4.9 % (ref 4.6–6.5)

## 2020-06-10 LAB — LIPID PANEL
Cholesterol: 176 mg/dL (ref 0–200)
HDL: 44 mg/dL (ref >39.00)
LDL Cholesterol: 94 mg/dL (ref 0–99)
NonHDL: 131.87
Total CHOL/HDL Ratio: 4
Triglycerides: 187 mg/dL — ABNORMAL HIGH (ref 0.0–149.0)
VLDL: 37.4 mg/dL (ref 0.0–40.0)

## 2020-06-10 MED ORDER — AMLODIPINE BESYLATE 2.5 MG PO TABS: 2.5000 mg | ORAL_TABLET | Freq: Every day | ORAL | 1 refills | Status: DC

## 2020-06-10 MED ORDER — OMEPRAZOLE 20 MG PO CPDR: 20.0000 mg | DELAYED_RELEASE_CAPSULE | Freq: Every day | ORAL | 3 refills | Status: DC

## 2020-06-10 NOTE — Patient Instructions (Signed)

## 2020-06-10 NOTE — Progress Notes (Signed)
This visit occurred during the SARS-CoV-2 public health emergency.  Safety protocols were in place, including screening questions prior to the visit, additional usage of staff PPE, and extensive cleaning of exam room while observing appropriate contact time as indicated for disinfecting solutions.    Patient ID: Micheal Lawson, male  DOB: March 13, 1966, 54 y.o.   MRN: [id number] Patient Care Team    Relationship Specialty Notifications Start End  Ma Hillock, DO PCP - General Family Medicine  06/16/18   Milus Banister, MD Attending Physician Gastroenterology  06/18/18   Audie Box, MD Referring Physician Ophthalmology  06/18/18   Druscilla Brownie, MD Referring Physician Dermatology  06/05/19     Chief Complaint  Patient presents with  . Annual Exam    CPE    Subjective:  Micheal Lawson is a 54 y.o. male present for CPE. All past medical history, surgical history, allergies, family history, immunizations, medications and social history were updated in the electronic medical record today. All recent labs, ED visits and hospitalizations within the last year were reviewed.  Health maintenance:  Colonoscopy: completed 06/2016, by Dr. Ardis Hughs- hyperplastic polyp x1. follow up 10 years (2027). Immunizations: tdap UTD 2018, Influenza UTD gets at work (encouraged yearly), shingrix series completed.Covid series completed Infectious disease screening: HIV completed 2017 PSA: fhx in father (79) PSA yearly recommended.  Lab Results  Component Value Date   PSA 0.86 06/05/2019   PSA 1.00 06/18/2018   PSA 1.01 04/30/2017  , pt was counseled on prostate cancer screenings.  Assistive device: none Oxygen ZJI:RCVE Patient has a Dental home. Hospitalizations/ED visits: reviewed  Gastroesophageal reflux disease without esophagitis Use PPI daily now. He had a couple flares since last visit and started daily- controlled  Essential hypertension/hypertrig Pt reports compliance   withamlodipine.Patient denies chest pain, shortness of breath, dizziness or lower extremity edema.  He did start fish oil 1000 mg. Labs UTD 06/2019. Exercise: routinely exercises. RF: HTN, FHX, hypertrig  Depression screen [facility] 2/9 06/10/2020 06/05/2019 06/18/2018  Decreased Interest 0 0 0  Down, Depressed, Hopeless 0 0 0  PHQ - 2 Score 0 0 0   No flowsheet data found.     Fall Risk  06/05/2019 06/18/2018  Falls in the past year? 0 No  Number falls in past yr: 0 -  Injury with Fall? 0 -  Follow up Falls evaluation completed -    Immunization History  Administered Date(s) Administered  . Influenza Whole 12/27/2008  . Influenza,inj,Quad PF,6+ Mos 06/10/2020  . Influenza-Unspecified 07/03/2017  . PFIZER SARS-COV-2 Vaccination 10/23/2019, 11/13/2019  . Td 05/08/2001, 12/27/2008  . Tdap 03/30/2017  . Zoster Recombinat (Shingrix) 06/18/2018, 09/17/2018   Past Medical History:  Diagnosis Date  . ALLERGIC RHINITIS 05/08/2007   Qualifier: Diagnosis of  By: Marca Ancona RMA, [name]    . COVID-19 virus infection 10/2019  . Frequent headaches   . GLAUCOMA 04/01/2009   DR. Bryan at [facility]  . History of colon polyps    hyperplastic - Dr. Ardis Hughs  . Hypertension    No Known Allergies Past Surgical History:  Procedure Laterality Date  . CYST REMOVAL NECK    . ELECTROCARDIOGRAM  04/04/2007  . SHOULDER ARTHROSCOPY WITH ROTATOR CUFF REPAIR AND SUBACROMIAL DECOMPRESSION  09/26/2012   Procedure: SHOULDER ARTHROSCOPY WITH ROTATOR CUFF REPAIR AND SUBACROMIAL DECOMPRESSION;  Surgeon: Yvette Rack., MD;  Location: [place];  Service: Orthopedics;  Laterality: Right;  RIGHT SHOULDER ARTHROSCOPY WITH EXTENSIVE DEBRIDEMENT, SUBACROMIAL DECOMPRESSION,  PARTIAL ACROMIOPLASTY WITH CORACROMIAL RELEASE   Family History  Problem Relation Age of Onset  . Leukemia Sister   . Heart disease Father        CABG 52  . Hypertension Father   . Hyperlipidemia Father   . Prostate cancer Father 36  . Colon  cancer Maternal Aunt   . Arthritis Mother   . Hyperlipidemia Mother   . Hypertension Mother   . Arthritis Maternal Grandmother   . Breast cancer Maternal Grandmother   . Stroke Maternal Grandmother   . Arthritis Maternal Grandfather   . Arthritis Paternal Grandmother   . Arthritis Paternal Grandfather   . Heart disease Paternal Grandfather    Social History   Social History Narrative   Marital status/children/pets: Married   Education/employment: B.S., Network engineer for [employer]   Safety:      -Wears a bicycle helmet riding a bike: Yes     -smoke alarm in the home:Yes     - wears seatbelt: Yes     - Feels safe in their relationships: Yes    Allergies as of 06/10/2020   No Known Allergies     Medication List       Accurate as of June 10, 2020  9:00 AM. If you have any questions, ask your nurse or doctor.        amLODipine 2.5 MG tablet Commonly known as: NORVASC Take 1 tablet (2.5 mg total) by mouth daily.   cetirizine 10 MG tablet Commonly known as: ZYRTEC Take 10 mg by mouth daily.   dorzolamide-timolol 22.3-6.8 MG/ML ophthalmic solution Commonly known as: COSOPT Place 1 drop into both eyes 2 (two) times daily.   Fish Oil 1000 MG Caps Take 3 capsules by mouth daily.   multivitamin tablet Take 1 tablet by mouth daily.   omeprazole 20 MG capsule Commonly known as: PRILOSEC Take 1 capsule (20 mg total) by mouth daily.   Travoprost (BAK Free) 0.004 % Soln ophthalmic solution Commonly known as: TRAVATAN Place 1 drop into both eyes daily.      All past medical history, surgical history, allergies, family history, immunizations andmedications were updated in the EMR today and reviewed under the history and medication portions of their EMR.     No results found for this or any previous visit (from the past 2160 hour(s)).    ROS: 14 pt review of systems performed and negative (unless mentioned in an HPI)  Objective: BP 129/88 (BP Location: Left Arm, Patient  Position: Sitting, Cuff Size: Large)   Pulse (!) 58   Temp 98.2 F (36.8 C) (Oral)   Resp 16   Ht 6' 0.64" (1.845 m)   Wt 194 lb 3.2 oz (88.1 kg)   SpO2 97%   BMI 25.88 kg/m  Gen: Afebrile. No acute distress. Nontoxic in appearance, well-developed, well-nourished,  Pleasant male.  HENT: AT. [place]. Bilateral TM visualized and normal in appearance, normal external auditory canal. MMM, no oral lesions, adequate dentition. Bilateral nares within normal limits. Throat without erythema, ulcerations or exudates. no Cough on exam, no hoarseness on exam. Eyes:Pupils Equal Round Reactive to light, Extraocular movements intact,  Conjunctiva without redness, discharge or icterus. Neck/lymp/endocrine: Supple,no lymphadenopathy, no thyromegaly CV: RRR no murmur, no edema, +2/4 P posterior tibialis pulses. No JVD. Chest: CTAB, no wheeze, rhonchi or crackles. normal Respiratory effort. good Air movement. Abd: Soft. flat. NTND. BS present. no Masses palpated. No hepatosplenomegaly. No rebound tenderness or guarding. Skin: no rashes, purpura or petechiae. Warm and  well-perfused. Skin intact. Neuro/Msk: Normal gait. PERLA. EOMi. Alert. Oriented x3.  Cranial nerves II through XII intact. Muscle strength 5/5 upper/lower extremity. DTRs equal bilaterally. Psych: Normal affect, dress and demeanor. Normal speech. Normal thought content and judgment.  No exam data present  Assessment/plan: KING PINZON is a 54 y.o. male present for Gastroesophageal reflux disease without esophagitis Stable.  - continue omeprazole Essential hypertension/HLD Stable.  Continue amlodipine.  - CBC - Comp Met (CMET) - TSH Diabetes mellitus screening - Hemoglobin A1c Prostate cancer screening - PSA Need for influenza vaccination - Flu Vaccine QUAD 36+ mos IM Encounter for preventive health examination Patient was encouraged to exercise greater than 150 minutes a week. Patient was encouraged to choose a diet filled with  fresh fruits and vegetables, and lean meats. AVS provided to patient today for education/recommendation on gender specific health and safety maintenance. Colonoscopy: Due 2027 Immunizations: tdap UTD 2018, Influenza administered today(encouraged yearly), shingrix series completed.Covid series completed Infectious disease screening: HIV completed 2017 and hep C completed today PSA: fhx in father (79) PSA yearly recommended. Collected today Return in about 6 months (around 11/28/2020) for [place] (30 min) and 1 yr physical.  Orders Placed This Encounter  Procedures  . Flu Vaccine QUAD 36+ mos IM  . CBC  . Comp Met (CMET)  . TSH  . Hemoglobin A1c  . PSA  . Lipid panel  . Hepatitis C Antibody   Meds ordered this encounter  Medications  . omeprazole (PRILOSEC) 20 MG capsule    Sig: Take 1 capsule (20 mg total) by mouth daily.    Dispense:  90 capsule    Refill:  3  . amLODipine (NORVASC) 2.5 MG tablet    Sig: Take 1 tablet (2.5 mg total) by mouth daily.    Dispense:  90 tablet    Refill:  1   Referral Orders  No referral(s) requested today     Note is dictated utilizing voice recognition software. Although note has been proof read prior to signing, occasional typographical errors still can be missed. If any questions arise, please do not hesitate to call for verification.  Electronically signed by: Howard Pouch, DO [place]

## 2020-06-14 LAB — HEPATITIS C ANTIBODY
Hepatitis C Ab: NONREACTIVE
SIGNAL TO CUT-OFF: 0.01 (ref <1.00)

## 2020-06-14 NOTE — Progress Notes (Signed)
Left VM for CB 

## 2020-06-14 NOTE — Progress Notes (Signed)
Notified pt of lab results 

## 2020-12-16 ENCOUNTER — Ambulatory Visit: Payer: Self-pay | Attending: Internal Medicine

## 2020-12-16 ENCOUNTER — Other Ambulatory Visit (HOSPITAL_COMMUNITY): Payer: Self-pay | Admitting: Internal Medicine

## 2020-12-16 DIAGNOSIS — Z23 Encounter for immunization: Secondary | ICD-10-CM

## 2020-12-16 NOTE — Progress Notes (Signed)
   Covid-19 Vaccination Clinic  Name:  Micheal Lawson    MRN: [id number] DOB: [birth year]  12/16/2020  Mr. Micheal Lawson was observed post Covid-19 immunization for 15 minutes without incident. He was provided with Vaccine Information Sheet and instruction to access the V-Safe system.   Mr. Micheal Lawson was instructed to call 911 with any severe reactions post vaccine: Marland Kitchen Difficulty breathing  . Swelling of face and throat  . A fast heartbeat  . A bad rash all over body  . Dizziness and weakness   Immunizations Administered    Name Date Dose VIS Date Route   Moderna COVID-19 Vaccine 12/16/2020  1:17 PM 0.5 mL 07/27/2020 Intramuscular   Manufacturer: Levan Hurst   Lot: [id number]   NDC: 15520-802-23   PFIZER Comrnaty(Gray TOP) Covid-19 Vaccine 12/16/2020 12:19 PM 0.3 mL 09/15/2020 Intramuscular   Manufacturer: [place]   Lot: [id number]   [place]: [phone]

## 2020-12-27 ENCOUNTER — Other Ambulatory Visit (HOSPITAL_BASED_OUTPATIENT_CLINIC_OR_DEPARTMENT_OTHER): Payer: Self-pay

## 2021-01-02 ENCOUNTER — Other Ambulatory Visit: Payer: Self-pay | Admitting: Family Medicine

## 2021-01-02 DIAGNOSIS — I10 Essential (primary) hypertension: Secondary | ICD-10-CM

## 2021-01-09 ENCOUNTER — Other Ambulatory Visit (HOSPITAL_COMMUNITY): Payer: Self-pay

## 2021-01-09 MED FILL — Travoprost Ophth Soln 0.004% (Benzalkonium Free) (BAK Free): OPHTHALMIC | 25 days supply | Qty: 2.5 | Fill #0 | Status: AC

## 2021-01-11 ENCOUNTER — Other Ambulatory Visit (HOSPITAL_COMMUNITY): Payer: Self-pay

## 2021-02-02 ENCOUNTER — Other Ambulatory Visit: Payer: Self-pay | Admitting: Family Medicine

## 2021-02-02 DIAGNOSIS — I10 Essential (primary) hypertension: Secondary | ICD-10-CM

## 2021-02-03 ENCOUNTER — Other Ambulatory Visit (HOSPITAL_COMMUNITY): Payer: Self-pay

## 2021-02-10 ENCOUNTER — Ambulatory Visit (INDEPENDENT_AMBULATORY_CARE_PROVIDER_SITE_OTHER): Payer: 59 | Admitting: Family Medicine

## 2021-02-10 ENCOUNTER — Other Ambulatory Visit: Payer: Self-pay

## 2021-02-10 ENCOUNTER — Other Ambulatory Visit (HOSPITAL_BASED_OUTPATIENT_CLINIC_OR_DEPARTMENT_OTHER): Payer: Self-pay

## 2021-02-10 ENCOUNTER — Encounter: Payer: Self-pay | Admitting: Family Medicine

## 2021-02-10 VITALS — BP 121/79 | HR 60 | Temp 98.5°F | Ht 73.0 in | Wt 193.0 lb

## 2021-02-10 DIAGNOSIS — E781 Pure hyperglyceridemia: Secondary | ICD-10-CM

## 2021-02-10 DIAGNOSIS — I10 Essential (primary) hypertension: Secondary | ICD-10-CM

## 2021-02-10 DIAGNOSIS — K219 Gastro-esophageal reflux disease without esophagitis: Secondary | ICD-10-CM

## 2021-02-10 MED ORDER — OMEPRAZOLE 20 MG PO CPDR
DELAYED_RELEASE_CAPSULE | Freq: Every day | ORAL | 3 refills | Status: DC
Start: 2021-02-10 — End: 2021-06-23
  Filled 2021-02-10 – 2021-03-27 (×2): qty 90, 90d supply, fill #0

## 2021-02-10 MED ORDER — AMLODIPINE BESYLATE 2.5 MG PO TABS
2.5000 mg | ORAL_TABLET | Freq: Every day | ORAL | 1 refills | Status: DC
  Filled 2021-02-10: qty 90, 90d supply, fill #0
  Filled 2021-05-03: qty 90, 90d supply, fill #1

## 2021-02-10 NOTE — Progress Notes (Signed)
This visit occurred during the SARS-CoV-2 public health emergency.  Safety protocols were in place, including screening questions prior to the visit, additional usage of staff PPE, and extensive cleaning of exam room while observing appropriate contact time as indicated for disinfecting solutions.    Patient ID: Micheal Lawson, male  DOB: Sep 18, 1966, 55 y.o.   MRN: [id number] Patient Care Team    Relationship Specialty Notifications Start End  Ma Hillock, DO PCP - General Family Medicine  06/16/18   Milus Banister, MD Attending Physician Gastroenterology  06/18/18   Audie Box, MD Referring Physician Ophthalmology  06/18/18   Druscilla Brownie, MD Referring Physician Dermatology  06/05/19     Chief Complaint  Patient presents with  . Follow-up    [facility]; pt is not fasting   . Hypertension    Subjective: Micheal Lawson is a 55 y.o. male present for [facility] Gastroesophageal reflux disease without esophagitis Using PPI daily now. He had a couple flares since last visit and started daily- controlled.   Essential hypertension/hypertrig Pt reports compliance  withamlodipine 2.5mg . Patient denies chest pain, shortness of breath, dizziness or lower extremity edema.  He did start fish oil 1000 mg. Labs UTD 06/2019. Exercise: routinely exercises. RF: HTN, FHX, hypertrig  Depression screen [facility] 2/9 02/10/2021 06/10/2020 06/05/2019 06/18/2018  Decreased Interest 0 0 0 0  Down, Depressed, Hopeless 0 0 0 0  PHQ - 2 Score 0 0 0 0   No flowsheet data found.     Fall Risk  06/05/2019 06/18/2018  Falls in the past year? 0 No  Number falls in past yr: 0 -  Injury with Fall? 0 -  Follow up Falls evaluation completed -    Immunization History  Administered Date(s) Administered  . Influenza Whole 12/27/2008  . Influenza,inj,Quad PF,6+ Mos 06/10/2020  . Influenza-Unspecified 07/03/2017  . Moderna Sars-Covid-2 Vaccination 12/16/2020  . PFIZER Comirnaty(Gray Top)Covid-19  Tri-Sucrose Vaccine 12/16/2020  . PFIZER(Purple Top)SARS-COV-2 Vaccination 10/23/2019, 11/13/2019  . Td 05/08/2001, 12/27/2008  . Tdap 03/30/2017  . Zoster Recombinat (Shingrix) 06/18/2018, 09/17/2018   Past Medical History:  Diagnosis Date  . ALLERGIC RHINITIS 05/08/2007   Qualifier: Diagnosis of  By: Marca Ancona RMA, [name]    . COVID-19 virus infection 10/2019  . Frequent headaches   . GLAUCOMA 04/01/2009   DR. Bryan at [facility]  . History of colon polyps    hyperplastic - Dr. Ardis Hughs  . Hypertension    No Known Allergies Past Surgical History:  Procedure Laterality Date  . CYST REMOVAL NECK    . ELECTROCARDIOGRAM  04/04/2007  . SHOULDER ARTHROSCOPY WITH ROTATOR CUFF REPAIR AND SUBACROMIAL DECOMPRESSION  09/26/2012   Procedure: SHOULDER ARTHROSCOPY WITH ROTATOR CUFF REPAIR AND SUBACROMIAL DECOMPRESSION;  Surgeon: Yvette Rack., MD;  Location: [place];  Service: Orthopedics;  Laterality: Right;  RIGHT SHOULDER ARTHROSCOPY WITH EXTENSIVE DEBRIDEMENT, SUBACROMIAL DECOMPRESSION, PARTIAL ACROMIOPLASTY WITH CORACROMIAL RELEASE   Family History  Problem Relation Age of Onset  . Leukemia Sister   . Heart disease Father        CABG 90  . Hypertension Father   . Hyperlipidemia Father   . Prostate cancer Father 64  . Colon cancer Maternal Aunt   . Arthritis Mother   . Hyperlipidemia Mother   . Hypertension Mother   . Arthritis Maternal Grandmother   . Breast cancer Maternal Grandmother   . Stroke Maternal Grandmother   . Arthritis Maternal Grandfather   . Arthritis Paternal Grandmother   .  Arthritis Paternal Grandfather   . Heart disease Paternal Grandfather    Social History   Social History Narrative   Marital status/children/pets: Married   Education/employment: B.S., Network engineer for [employer]   Safety:      -Wears a bicycle helmet riding a bike: Yes     -smoke alarm in the home:Yes     - wears seatbelt: Yes     - Feels safe in their relationships: Yes    Allergies as of  02/10/2021   No Known Allergies     Medication List       Accurate as of Feb 10, 2021  1:13 PM. If you have any questions, ask your nurse or doctor.        STOP taking these medications   Pfizer-BioNT COVID-19 Vac-TriS Susp injection Generic drug: COVID-19 mRNA Vac-TriS Therapist, music) Stopped by: Howard Pouch, DO     TAKE these medications   amLODipine 2.5 MG tablet Commonly known as: NORVASC Take 1 tablet (2.5 mg total) by mouth daily. What changed:   how much to take  additional instructions Changed by: Howard Pouch, DO   cetirizine 10 MG tablet Commonly known as: ZYRTEC Take 10 mg by mouth daily.   dorzolamide-timolol 22.3-6.8 MG/ML ophthalmic solution Commonly known as: COSOPT Place 1 drop into both eyes 2 (two) times daily.   Fish Oil 1000 MG Caps Take 3 capsules by mouth daily.   multivitamin tablet Take 1 tablet by mouth daily.   omeprazole 20 MG capsule Commonly known as: PRILOSEC TAKE 1 CAPSULE BY MOUTH DAILY   Travoprost (BAK Free) 0.004 % Soln ophthalmic solution Commonly known as: TRAVATAN PLACE 1 DROP IN BOTH EYES AT BEDTIME   Travoprost (BAK Free) 0.004 % Soln ophthalmic solution Commonly known as: TRAVATAN Place 1 drop into both eyes daily.      All past medical history, surgical history, allergies, family history, immunizations andmedications were updated in the EMR today and reviewed under the history and medication portions of their EMR.     No results found for this or any previous visit (from the past 2160 hour(s)).    ROS: 14 pt review of systems performed and negative (unless mentioned in an HPI)  Objective: BP 121/79   Pulse 60   Temp 98.5 F (36.9 C) (Oral)   Ht 6\' 1"  (1.854 m)   Wt 193 lb (87.5 kg)   SpO2 98%   BMI 25.46 kg/m  Gen: Afebrile. No acute distress. Nontoxic pleasant male.  HENT: AT. [place].  Eyes:Pupils Equal Round Reactive to light, Extraocular movements intact,  Conjunctiva without redness, discharge or  icterus. Neck/lymp/endocrine: Supple,no lymphadenopathy, no thyromegaly CV: RRR no murmur, no edema Chest: CTAB, no wheeze or crackles Neuro:  Normal gait. PERLA. EOMi. Alert. Oriented x3 Psych: Normal affect, dress and demeanor. Normal speech. Normal thought content and judgment.   No exam data present  Assessment/plan: Micheal Lawson is a 55 y.o. male present for Gastroesophageal reflux disease without esophagitis Stable.  - continue omeprazole  Essential hypertension/HLD Stable.  Continue amlodipine 2.5 mg QD Low sodium.  Routine exercise.  Next appt cpe    Return in about 5 months (around 07/10/2021) for CPE (30 min), [facility] (30 min).  No orders of the defined types were placed in this encounter.  Meds ordered this encounter  Medications  . amLODipine (NORVASC) 2.5 MG tablet    Sig: Take 1 tablet (2.5 mg total) by mouth daily.    Dispense:  90 tablet  Refill:  1  . omeprazole (PRILOSEC) 20 MG capsule    Sig: TAKE 1 CAPSULE BY MOUTH DAILY    Dispense:  90 capsule    Refill:  3   Referral Orders  No referral(s) requested today     Note is dictated utilizing voice recognition software. Although note has been proof read prior to signing, occasional typographical errors still can be missed. If any questions arise, please do not hesitate to call for verification.  Electronically signed by: Howard Pouch, DO [place]

## 2021-02-10 NOTE — Patient Instructions (Addendum)
  Great to see you today.  I have refilled the medication(s) we provide.   Next appt after Sept 5th for your physical if not already scheduled. Marland Kitchen

## 2021-03-08 ENCOUNTER — Other Ambulatory Visit (HOSPITAL_COMMUNITY): Payer: Self-pay

## 2021-03-08 DIAGNOSIS — Z135 Encounter for screening for eye and ear disorders: Secondary | ICD-10-CM | POA: Diagnosis not present

## 2021-03-08 DIAGNOSIS — H401111 Primary open-angle glaucoma, right eye, mild stage: Secondary | ICD-10-CM | POA: Diagnosis not present

## 2021-03-08 DIAGNOSIS — H5213 Myopia, bilateral: Secondary | ICD-10-CM | POA: Diagnosis not present

## 2021-03-08 DIAGNOSIS — H401122 Primary open-angle glaucoma, left eye, moderate stage: Secondary | ICD-10-CM | POA: Diagnosis not present

## 2021-03-08 DIAGNOSIS — H524 Presbyopia: Secondary | ICD-10-CM | POA: Diagnosis not present

## 2021-03-08 MED ORDER — TRAVOPROST (BAK FREE) 0.004 % OP SOLN
OPHTHALMIC | 3 refills | Status: AC
  Filled 2021-03-08: qty 7.5, 75d supply, fill #0
  Filled 2021-03-10: qty 2.5, 25d supply, fill #0
  Filled 2021-05-19: qty 2.5, 25d supply, fill #1
  Filled 2021-07-24: qty 2.5, 25d supply, fill #2
  Filled 2021-09-18: qty 2.5, 25d supply, fill #3

## 2021-03-08 MED ORDER — DORZOLAMIDE HCL-TIMOLOL MAL 2-0.5 % OP SOLN
OPHTHALMIC | 3 refills | Status: DC
  Filled 2021-03-08: qty 20, 90d supply, fill #0

## 2021-03-09 ENCOUNTER — Other Ambulatory Visit (HOSPITAL_COMMUNITY): Payer: Self-pay

## 2021-03-10 ENCOUNTER — Other Ambulatory Visit (HOSPITAL_COMMUNITY): Payer: Self-pay

## 2021-03-14 ENCOUNTER — Other Ambulatory Visit (HOSPITAL_COMMUNITY): Payer: Self-pay

## 2021-03-28 ENCOUNTER — Other Ambulatory Visit (HOSPITAL_BASED_OUTPATIENT_CLINIC_OR_DEPARTMENT_OTHER): Payer: Self-pay

## 2021-03-31 ENCOUNTER — Other Ambulatory Visit (HOSPITAL_COMMUNITY): Payer: Self-pay

## 2021-05-04 ENCOUNTER — Other Ambulatory Visit (HOSPITAL_BASED_OUTPATIENT_CLINIC_OR_DEPARTMENT_OTHER): Payer: Self-pay

## 2021-05-08 DIAGNOSIS — H401111 Primary open-angle glaucoma, right eye, mild stage: Secondary | ICD-10-CM | POA: Diagnosis not present

## 2021-05-08 DIAGNOSIS — H401122 Primary open-angle glaucoma, left eye, moderate stage: Secondary | ICD-10-CM | POA: Diagnosis not present

## 2021-05-19 ENCOUNTER — Other Ambulatory Visit (HOSPITAL_COMMUNITY): Payer: Self-pay

## 2021-06-20 ENCOUNTER — Telehealth: Payer: Self-pay

## 2021-06-20 NOTE — Telephone Encounter (Signed)
Patient has CPE scheduled on Friday with Dr. Raoul Pitch.  Requesting to come in tomorrow for fasting labs,  Please call [phone]

## 2021-06-20 NOTE — Telephone Encounter (Signed)
Patient called back, told him the following: Unfortunately, we were unable to do pre-labs for preventative visits.  Insurance companies have been denying payment when labs are not ordered during the office visit for physicals.  He understood.

## 2021-06-20 NOTE — Telephone Encounter (Signed)
Unfortunately, we were unable to do pre-labs for preventative visits.  Insurance companies have been denying payment when labs are not ordered during the office visit for physicals.

## 2021-06-20 NOTE — Telephone Encounter (Signed)
Please review and advise.

## 2021-06-20 NOTE — Telephone Encounter (Signed)
LM for pt to returncall

## 2021-06-23 ENCOUNTER — Other Ambulatory Visit: Payer: Self-pay

## 2021-06-23 ENCOUNTER — Encounter: Payer: Self-pay | Admitting: Family Medicine

## 2021-06-23 ENCOUNTER — Other Ambulatory Visit (HOSPITAL_BASED_OUTPATIENT_CLINIC_OR_DEPARTMENT_OTHER): Payer: Self-pay

## 2021-06-23 ENCOUNTER — Ambulatory Visit (INDEPENDENT_AMBULATORY_CARE_PROVIDER_SITE_OTHER): Payer: 59 | Admitting: Family Medicine

## 2021-06-23 VITALS — BP 120/77 | HR 51 | Temp 97.7°F | Ht 73.0 in | Wt 194.0 lb

## 2021-06-23 DIAGNOSIS — Z0000 Encounter for general adult medical examination without abnormal findings: Secondary | ICD-10-CM | POA: Diagnosis not present

## 2021-06-23 DIAGNOSIS — Z125 Encounter for screening for malignant neoplasm of prostate: Secondary | ICD-10-CM | POA: Diagnosis not present

## 2021-06-23 DIAGNOSIS — K219 Gastro-esophageal reflux disease without esophagitis: Secondary | ICD-10-CM | POA: Diagnosis not present

## 2021-06-23 DIAGNOSIS — I10 Essential (primary) hypertension: Secondary | ICD-10-CM

## 2021-06-23 DIAGNOSIS — Z23 Encounter for immunization: Secondary | ICD-10-CM | POA: Diagnosis not present

## 2021-06-23 DIAGNOSIS — E781 Pure hyperglyceridemia: Secondary | ICD-10-CM

## 2021-06-23 DIAGNOSIS — Z131 Encounter for screening for diabetes mellitus: Secondary | ICD-10-CM

## 2021-06-23 LAB — COMPREHENSIVE METABOLIC PANEL
ALT: 34 U/L (ref 0–53)
AST: 22 U/L (ref 0–37)
Albumin: 4.6 g/dL (ref 3.5–5.2)
Alkaline Phosphatase: 74 U/L (ref 39–117)
BUN: 14 mg/dL (ref 6–23)
CO2: 27 mEq/L (ref 19–32)
Calcium: 9.5 mg/dL (ref 8.4–10.5)
Chloride: 102 mEq/L (ref 96–112)
Creatinine, Ser: 1.05 mg/dL (ref 0.40–1.50)
GFR: 80 mL/min (ref >60.00)
Glucose, Bld: 85 mg/dL (ref 70–99)
Potassium: 4.1 mEq/L (ref 3.5–5.1)
Sodium: 138 mEq/L (ref 135–145)
Total Bilirubin: 1 mg/dL (ref 0.2–1.2)
Total Protein: 7.3 g/dL (ref 6.0–8.3)

## 2021-06-23 LAB — CBC WITH DIFFERENTIAL/PLATELET
Basophils Absolute: 0.1 10*3/uL (ref 0.0–0.1)
Basophils Relative: 1.3 % (ref 0.0–3.0)
Eosinophils Absolute: 0.2 10*3/uL (ref 0.0–0.7)
Eosinophils Relative: 3.3 % (ref 0.0–5.0)
HCT: 44.7 % (ref 39.0–52.0)
Hemoglobin: 15.4 g/dL (ref 13.0–17.0)
Lymphocytes Relative: 42.5 % (ref 12.0–46.0)
Lymphs Abs: 2.3 10*3/uL (ref 0.7–4.0)
MCHC: 34.5 g/dL (ref 30.0–36.0)
MCV: 90.7 fl (ref 78.0–100.0)
Monocytes Absolute: 0.7 10*3/uL (ref 0.1–1.0)
Monocytes Relative: 12.8 % — ABNORMAL HIGH (ref 3.0–12.0)
Neutro Abs: 2.2 10*3/uL (ref 1.4–7.7)
Neutrophils Relative %: 40.1 % — ABNORMAL LOW (ref 43.0–77.0)
Platelets: 281 10*3/uL (ref 150.0–400.0)
RBC: 4.93 Mil/uL (ref 4.22–5.81)
RDW: 12.4 % (ref 11.5–15.5)
WBC: 5.4 10*3/uL (ref 4.0–10.5)

## 2021-06-23 LAB — HEMOGLOBIN A1C: Hgb A1c MFr Bld: 5.1 % (ref 4.6–6.5)

## 2021-06-23 LAB — PSA: PSA: 1.2 ng/mL (ref 0.10–4.00)

## 2021-06-23 LAB — LIPID PANEL
Cholesterol: 173 mg/dL (ref 0–200)
HDL: 52.6 mg/dL (ref >39.00)
LDL Cholesterol: 94 mg/dL (ref 0–99)
NonHDL: 120.75
Total CHOL/HDL Ratio: 3
Triglycerides: 136 mg/dL (ref 0.0–149.0)
VLDL: 27.2 mg/dL (ref 0.0–40.0)

## 2021-06-23 LAB — TSH: TSH: 1.77 u[IU]/mL (ref 0.35–5.50)

## 2021-06-23 MED ORDER — OMEPRAZOLE 20 MG PO CPDR
DELAYED_RELEASE_CAPSULE | Freq: Every day | ORAL | 3 refills | Status: DC
  Filled 2021-06-23: qty 90, 90d supply, fill #0
  Filled 2021-10-12: qty 90, 90d supply, fill #1
  Filled 2022-01-11: qty 90, 90d supply, fill #2
  Filled 2022-03-13 – 2022-04-11 (×2): qty 90, 90d supply, fill #3

## 2021-06-23 MED ORDER — AMLODIPINE BESYLATE 2.5 MG PO TABS
2.5000 mg | ORAL_TABLET | Freq: Every day | ORAL | 1 refills | Status: DC
  Filled 2021-06-23 – 2021-08-09 (×3): qty 90, 90d supply, fill #0
  Filled 2021-11-15: qty 90, 90d supply, fill #1
  Filled ????-??-??: fill #0

## 2021-06-23 NOTE — Progress Notes (Signed)
This visit occurred during the SARS-CoV-2 public health emergency.  Safety protocols were in place, including screening questions prior to the visit, additional usage of staff PPE, and extensive cleaning of exam room while observing appropriate contact time as indicated for disinfecting solutions.    Patient ID: Micheal Lawson, male  DOB: 1965/11/14, 55 y.o.   MRN: [id number] Patient Care Team    Relationship Specialty Notifications Start End  Ma Hillock, DO PCP - General Family Medicine  06/16/18   Milus Banister, MD Attending Physician Gastroenterology  06/18/18   Audie Box, MD Referring Physician Ophthalmology  06/18/18   Druscilla Brownie, MD Referring Physician Dermatology  06/05/19     Chief Complaint  Patient presents with   Annual Exam    Pt is fasting    Subjective: Micheal Lawson is a 55 y.o. male present for CPE. All past medical history, surgical history, allergies, family history, immunizations, medications and social history were Updated in the electronic medical record today. All recent labs, ED visits and hospitalizations within the last year were reviewed.  Health maintenance:  Colonoscopy: completed 06/2016, by Dr. Ardis Hughs- hyperplastic polyp x1. follow up 10 years (2027). Immunizations: tdap UTD 2018, Influenza UTD gets at work (encouraged yearly), shingrix series completed.Covid series completed Infectious disease screening: HIV completed 2017 PSA: fhx in father (79) PSA yearly recommended.  PSA:  Lab Results  Component Value Date   PSA 0.78 06/10/2020   PSA 0.86 06/05/2019   PSA 1.00 06/18/2018  , pt was counseled on prostate cancer screenings.  Assistive device: none Oxygen [id number] Patient has a Dental home. Hospitalizations/ED visits: reviewed  Gastroesophageal reflux disease without esophagitis Using PPI daily now- doing great. He had a couple flares since last visit and started daily- controlled.    Essential  hypertension/hypertrig Pt reportscompliancewith amlodipine 2.'5mg'$ . Patient denies chest pain, shortness of breath, dizziness or lower extremity edema.  He did start fish oil 1000 mg.  Labs UTD 06/2019. Exercise: routinely exercises. RF: HTN, FHX, hypertrig  Depression screen [facility] 2/9 06/23/2021 02/10/2021 06/10/2020 06/05/2019 06/18/2018  Decreased Interest 0 0 0 0 0  Down, Depressed, Hopeless 0 0 0 0 0  PHQ - 2 Score 0 0 0 0 0   No flowsheet data found.    Fall Risk  06/05/2019 06/18/2018  Falls in the past year? 0 No  Number falls in past yr: 0 -  Injury with Fall? 0 -  Follow up Falls evaluation completed -    Immunization History  Administered Date(s) Administered   Influenza Whole 12/27/2008   Influenza,inj,Quad PF,6+ Mos 06/10/2020, 06/23/2021   Influenza-Unspecified 07/03/2017   Moderna Sars-Covid-2 Vaccination 12/16/2020   PFIZER Comirnaty(Gray Top)Covid-19 Tri-Sucrose Vaccine 12/16/2020   PFIZER(Purple Top)SARS-COV-2 Vaccination 10/23/2019, 11/13/2019   Td 05/08/2001, 12/27/2008   Tdap 03/30/2017   Zoster Recombinat (Shingrix) 06/18/2018, 09/17/2018     Past Medical History:  Diagnosis Date   ALLERGIC RHINITIS 05/08/2007   Qualifier: Diagnosis of  By: Marca Ancona Lawson, [name]     COVID-19 virus infection 10/2019   Frequent headaches    GLAUCOMA 04/01/2009   DR. Bryan at [facility] [place]   History of colon polyps    hyperplastic - Dr. Ardis Hughs   Hypertension    No Known Allergies Past Surgical History:  Procedure Laterality Date   CYST REMOVAL NECK     ELECTROCARDIOGRAM  04/04/2007   SHOULDER ARTHROSCOPY WITH ROTATOR CUFF REPAIR AND SUBACROMIAL DECOMPRESSION  09/26/2012   Procedure: SHOULDER ARTHROSCOPY WITH  ROTATOR CUFF REPAIR AND SUBACROMIAL DECOMPRESSION;  Surgeon: Yvette Rack., MD;  Location: [place];  Service: Orthopedics;  Laterality: Right;  RIGHT SHOULDER ARTHROSCOPY WITH EXTENSIVE DEBRIDEMENT, SUBACROMIAL DECOMPRESSION, PARTIAL ACROMIOPLASTY WITH CORACROMIAL RELEASE    Family History  Problem Relation Age of Onset   Leukemia Sister    Heart disease Father        CABG 40   Hypertension Father    Hyperlipidemia Father    Prostate cancer Father 46   Colon cancer Maternal Aunt    Arthritis Mother    Hyperlipidemia Mother    Hypertension Mother    Arthritis Maternal Grandmother    Breast cancer Maternal Grandmother    Stroke Maternal Grandmother    Arthritis Maternal Grandfather    Arthritis Paternal Grandmother    Arthritis Paternal Grandfather    Heart disease Paternal Grandfather    Social History   Social History Narrative   Marital status/children/pets: Married   Education/employment: B.S., Network engineer for [employer]   Safety:      -Wears a bicycle helmet riding a bike: Yes     -smoke alarm in the home:Yes     - wears seatbelt: Yes     - Feels safe in their relationships: Yes    Allergies as of 06/23/2021   No Known Allergies      Medication List        Accurate as of June 23, 2021  2:57 PM. If you have any questions, ask your nurse or doctor.          amLODipine 2.5 MG tablet Commonly known as: NORVASC Take 1 tablet (2.5 mg total) by mouth daily.   cetirizine 10 MG tablet Commonly known as: ZYRTEC Take 10 mg by mouth daily.   dorzolamide-timolol 22.3-6.8 MG/ML ophthalmic solution Commonly known as: COSOPT Place 1 drop into both eyes 2 (two) times daily. What changed: Another medication with the same name was removed. Continue taking this medication, and follow the directions you see here. Changed by: Howard Pouch, DO   Fish Oil 1000 MG Caps Take 3 capsules by mouth daily.   multivitamin tablet Take 1 tablet by mouth daily.   omeprazole 20 MG capsule Commonly known as: PRILOSEC TAKE 1 CAPSULE BY MOUTH DAILY   Travoprost (BAK Free) 0.004 % Soln ophthalmic solution Commonly known as: TRAVATAN Place 1 drop in each eye once a day at bedtime. What changed: Another medication with the same name was removed.  Continue taking this medication, and follow the directions you see here. Changed by: Howard Pouch, DO       All past medical history, surgical history, allergies, family history, immunizations andmedications were updated in the EMR today and reviewed under the history and medication portions of their EMR.     No results found for this or any previous visit (from the past 2160 hour(s)).   ROS: 14 pt review of systems performed and negative (unless mentioned in an HPI)  Objective: BP 120/77   Pulse (!) 51   Temp 97.7 F (36.5 C) (Oral)   Ht '6\' 1"'$  (1.854 m)   Wt 194 lb (88 kg)   SpO2 98%   BMI 25.60 kg/m  Gen: Afebrile. No acute distress. Nontoxic in appearance, well-developed, well-nourished,  pleasant male.  HENT: AT. [place]. Bilateral TM visualized and normal in appearance, normal external auditory canal. MMM, no oral lesions, adequate dentition. Bilateral nares within normal limits. Throat without erythema, ulcerations or exudates. no Cough on exam, no hoarseness  on exam. Eyes:Pupils Equal Round Reactive to light, Extraocular movements intact,  Conjunctiva without redness, discharge or icterus. Neck/lymp/endocrine: Supple,no lymphadenopathy, no thyromegaly CV: RRR no murmur, no edema, +2/4 P posterior tibialis pulses.  Chest: CTAB, no wheeze, rhonchi or crackles. normal Respiratory effort. good Air movement. Abd: Soft. flat. NTND. BS present. no Masses palpated. No hepatosplenomegaly. No rebound tenderness or guarding. Skin: no rashes, purpura or petechiae. Warm and well-perfused. Skin intact. Neuro/Msk: Normal gait. PERLA. EOMi. Alert. Oriented x3.  Cranial nerves II through XII intact. Muscle strength 5/5 upper/lower extremity. DTRs equal bilaterally. Psych: Normal affect, dress and demeanor. Normal speech. Normal thought content and judgment.  No results found.  Assessment/plan: QUADE OCANAS is a 55 y.o. male present for CPE Gastroesophageal reflux disease without  esophagitis Stable.  - continue omeprazole   Essential hypertension/HLD Stable.  Continue amlodipine 2.5 mg QD Low sodium.  Routine exercise.  - CBC with Differential/Platelet - Comprehensive metabolic panel - Lipid panel - TSH - amLODipine (NORVASC) 2.5 MG tablet; Take 1 tablet (2.5 mg total) by mouth daily.  Dispense: 90 tablet; Refill: 1  Prostate cancer screening - PSA Diabetes mellitus screening - Hemoglobin A1c Routine general medical examination at a health care facility Colonoscopy: completed 06/2016, by Dr. Ardis Hughs- hyperplastic polyp x1. follow up 10 years (2027). Immunizations: tdap UTD 2018, Influenza UTD provided today(encouraged yearly), shingrix series completed.Covid series completed Infectious disease screening: HIV completed 2017 PSA: fhx in father (79) PSA yearly recommended. > ordered today Patient was encouraged to exercise greater than 150 minutes a week. Patient was encouraged to choose a diet filled with fresh fruits and vegetables, and lean meats. AVS provided to patient today for education/recommendation on gender specific health and safety maintenance.  Return in about 5 months (around 12/07/2021) for [place] (30 min) and CPE [address].  Orders Placed This Encounter  Procedures   Flu Vaccine QUAD 6+ mos PF IM (Fluarix Quad PF)   CBC with Differential/Platelet   Comprehensive metabolic panel   Hemoglobin A1c   Lipid panel   TSH   PSA   Meds ordered this encounter  Medications   amLODipine (NORVASC) 2.5 MG tablet    Sig: Take 1 tablet (2.5 mg total) by mouth daily.    Dispense:  90 tablet    Refill:  1   omeprazole (PRILOSEC) 20 MG capsule    Sig: TAKE 1 CAPSULE BY MOUTH DAILY    Dispense:  90 capsule    Refill:  3    Referral Orders  No referral(s) requested today     Note is dictated utilizing voice recognition software. Although note has been proof read prior to signing, occasional typographical errors still can be missed. If any questions  arise, please do not hesitate to call for verification.  Electronically signed by: RHoward Pouch DO [place]

## 2021-06-23 NOTE — Patient Instructions (Signed)
Great to see you today.  I have refilled the medication(s) we provide.   If labs were collected, we will inform you of lab results once received either by echart message or telephone call.   - echart message- for normal results that have been seen by the patient already.   - telephone call: abnormal results or if patient has not viewed results in their echart.  Health Maintenance, Male Adopting a healthy lifestyle and getting preventive care are important in promoting health and wellness. Ask your health care provider about: The right schedule for you to have regular tests and exams. Things you can do on your own to prevent diseases and keep yourself healthy. What should I know about diet, weight, and exercise? Eat a healthy diet  Eat a diet that includes plenty of vegetables, fruits, low-fat dairy products, and lean protein. Do not eat a lot of foods that are high in solid fats, added sugars, or sodium. Maintain a healthy weight Body mass index (BMI) is a measurement that can be used to identify possible weight problems. It estimates body fat based on height and weight. Your health care provider can help determine your BMI and help you achieve or maintain a healthy weight. Get regular exercise Get regular exercise. This is one of the most important things you can do for your health. Most adults should: Exercise for at least 150 minutes each week. The exercise should increase your heart rate and make you sweat (moderate-intensity exercise). Do strengthening exercises at least twice a week. This is in addition to the moderate-intensity exercise. Spend less time sitting. Even light physical activity can be beneficial. Watch cholesterol and blood lipids Have your blood tested for lipids and cholesterol at 55 years of age, then have this test every 5 years. You may need to have your cholesterol levels checked more often if: Your lipid or cholesterol levels are high. You are older than 55  years of age. You are at high risk for heart disease. What should I know about cancer screening? Many types of cancers can be detected early and may often be prevented. Depending on your health history and family history, you may need to have cancer screening at various ages. This may include screening for: Colorectal cancer. Prostate cancer. Skin cancer. Lung cancer. What should I know about heart disease, diabetes, and high blood pressure? Blood pressure and heart disease High blood pressure causes heart disease and increases the risk of stroke. This is more likely to develop in people who have high blood pressure readings, are of African descent, or are overweight. Talk with your health care provider about your target blood pressure readings. Have your blood pressure checked: Every 3-5 years if you are 27-36 years of age. Every year if you are 45 years old or older. If you are between the ages of 75 and 29 and are a current or former smoker, ask your health care provider if you should have a one-time screening for abdominal aortic aneurysm (AAA). Diabetes Have regular diabetes screenings. This checks your fasting blood sugar level. Have the screening done: Once every three years after age 69 if you are at a normal weight and have a low risk for diabetes. More often and at a younger age if you are overweight or have a high risk for diabetes. What should I know about preventing infection? Hepatitis B If you have a higher risk for hepatitis B, you should be screened for this virus. Talk with your health care  provider to find out if you are at risk for hepatitis B infection. Hepatitis C Blood testing is recommended for: Everyone born from [birth year] through 1965. Anyone with known risk factors for hepatitis C. Sexually transmitted infections (STIs) You should be screened each year for STIs, including gonorrhea and chlamydia, if: You are sexually active and are younger than 55 years of age. You  are older than 55 years of age and your health care provider tells you that you are at risk for this type of infection. Your sexual activity has changed since you were last screened, and you are at increased risk for chlamydia or gonorrhea. Ask your health care provider if you are at risk. Ask your health care provider about whether you are at high risk for HIV. Your health care provider may recommend a prescription medicine to help prevent HIV infection. If you choose to take medicine to prevent HIV, you should first get tested for HIV. You should then be tested every 3 months for as long as you are taking the medicine. Follow these instructions at home: Lifestyle Do not use any products that contain nicotine or tobacco, such as cigarettes, e-cigarettes, and chewing tobacco. If you need help quitting, ask your health care provider. Do not use street drugs. Do not share needles. Ask your health care provider for help if you need support or information about quitting drugs. Alcohol use Do not drink alcohol if your health care provider tells you not to drink. If you drink alcohol: Limit how much you have to 0-2 drinks a day. Be aware of how much alcohol is in your drink. In the [place] one drink equals one 12 oz bottle of beer (355 mL), one 5 oz glass of wine (148 mL), or one 1 oz glass of hard liquor (44 mL). General instructions Schedule regular health, dental, and eye exams. Stay current with your vaccines. Tell your health care provider if: You often feel depressed. You have ever been abused or do not feel safe at home. Summary Adopting a healthy lifestyle and getting preventive care are important in promoting health and wellness. Follow your health care provider's instructions about healthy diet, exercising, and getting tested or screened for diseases. Follow your health care provider's instructions on monitoring your cholesterol and blood pressure. This information is not intended to  replace advice given to you by your health care provider. Make sure you discuss any questions you have with your health care provider. Document Revised: 12/02/2020 Document Reviewed: 09/17/2018 Elsevier Patient Education  2022 [employer].

## 2021-06-27 ENCOUNTER — Other Ambulatory Visit (HOSPITAL_BASED_OUTPATIENT_CLINIC_OR_DEPARTMENT_OTHER): Payer: Self-pay

## 2021-07-24 ENCOUNTER — Other Ambulatory Visit (HOSPITAL_COMMUNITY): Payer: Self-pay

## 2021-08-10 ENCOUNTER — Other Ambulatory Visit (HOSPITAL_BASED_OUTPATIENT_CLINIC_OR_DEPARTMENT_OTHER): Payer: Self-pay

## 2021-09-18 ENCOUNTER — Other Ambulatory Visit (HOSPITAL_COMMUNITY): Payer: Self-pay

## 2021-09-27 ENCOUNTER — Telehealth: Payer: Self-pay

## 2021-09-27 ENCOUNTER — Telehealth (INDEPENDENT_AMBULATORY_CARE_PROVIDER_SITE_OTHER): Payer: 59 | Admitting: Family Medicine

## 2021-09-27 ENCOUNTER — Other Ambulatory Visit: Payer: Self-pay

## 2021-09-27 ENCOUNTER — Other Ambulatory Visit (HOSPITAL_BASED_OUTPATIENT_CLINIC_OR_DEPARTMENT_OTHER): Payer: Self-pay

## 2021-09-27 ENCOUNTER — Encounter: Payer: Self-pay | Admitting: Family Medicine

## 2021-09-27 VITALS — Wt 188.0 lb

## 2021-09-27 DIAGNOSIS — U071 COVID-19: Secondary | ICD-10-CM

## 2021-09-27 MED ORDER — BENZONATATE 100 MG PO CAPS
200.0000 mg | ORAL_CAPSULE | Freq: Two times a day (BID) | ORAL | 0 refills | Status: DC
  Filled 2021-09-27: qty 20, 5d supply, fill #0

## 2021-09-27 MED ORDER — NIRMATRELVIR/RITONAVIR (PAXLOVID)TABLET
3.0000 | ORAL_TABLET | Freq: Two times a day (BID) | ORAL | 0 refills | Status: AC
  Filled 2021-09-27: qty 30, 5d supply, fill #0

## 2021-09-27 NOTE — Telephone Encounter (Signed)
A user error has taken place: encounter opened in error, closed for administrative reasons.

## 2021-09-27 NOTE — Patient Instructions (Signed)
10 Things You Can Do to Manage Your COVID-19 Symptoms at Home ?If you have possible or confirmed COVID-19 ?Stay home except to get medical care. ?Monitor your symptoms carefully. If your symptoms get worse, call your healthcare provider immediately. ?Get rest and stay hydrated. ?If you have a medical appointment, call the healthcare provider ahead of time and tell them that you have or may have COVID-19. ?For medical emergencies, call 911 and notify the dispatch personnel that you have or may have COVID-19. ?Cover your cough and sneezes with a tissue or use the inside of your elbow. ?Wash your hands often with soap and water for at least 20 seconds or clean your hands with an alcohol-based hand sanitizer that contains at least 60% alcohol. ?As much as possible, stay in a specific room and away from other people in your home. Also, you should use a separate bathroom, if available. If you need to be around other people in or outside of the home, wear a mask. ?Avoid sharing personal items with other people in your household, like dishes, towels, and bedding. ?Clean all surfaces that are touched often, like counters, tabletops, and doorknobs. Use household cleaning sprays or wipes according to the label instructions. ?cdc.gov/coronavirus ?04/22/2020 ?This information is not intended to replace advice given to you by your health care provider. Make sure you discuss any questions you have with your health care provider. ?Document Revised: 06/16/2021 Document Reviewed: 06/16/2021 ?Elsevier Patient Education ? 2022 [employer] ? ?

## 2021-09-27 NOTE — Progress Notes (Signed)
VIRTUAL VISIT VIA VIDEO  I connected with Micheal Lawson on 09/27/21 at 11:15 AM EST by a video enabled telemedicine application and verified that I am speaking with the correct person using two identifiers. Location patient: Home Location provider: [facility], Office Persons participating in the virtual visit: Patient, Dr. Raoul Pitch and Cyndra Numbers, CMA  I discussed the limitations of evaluation and management by telemedicine and the availability of in person appointments. The patient expressed understanding and agreed to proceed.   Micheal Lawson , May 26, 1966, 55 y.o., male MRN: [id number] Patient Care Team    Relationship Specialty Notifications Start End  Ma Hillock, DO PCP - General Family Medicine  06/16/18   Milus Banister, MD Attending Physician Gastroenterology  06/18/18   Audie Box, MD Referring Physician Ophthalmology  06/18/18   Druscilla Brownie, MD Referring Physician Dermatology  06/05/19     Chief Complaint  Patient presents with   Covid Positive    Current symptoms: nasal congestion, body aches, fever last night, headache, sore throat. At home covid test, yesterday with positive results     Subjective: Pt presents for an OV with complaints of acute illness with positive covid test. Symptoms of nasal congestion, body aches, fever, headache, sore throat that started 2 nights ago. He has been vaccinated for covid.  GFR is normal.  He is not on anticoagulation.  Taking decongestant and ibf.   Immunization History  Administered Date(s) Administered   Influenza Whole 12/27/2008   Influenza,inj,Quad PF,6+ Mos 06/10/2020, 06/23/2021   Influenza-Unspecified 07/03/2017   Moderna Sars-Covid-2 Vaccination 12/16/2020   PFIZER Comirnaty(Gray Top)Covid-19 Tri-Sucrose Vaccine 12/16/2020   PFIZER(Purple Top)SARS-COV-2 Vaccination 10/23/2019, 11/13/2019   Td 05/08/2001, 12/27/2008   Tdap 03/30/2017   Zoster Recombinat (Shingrix) 06/18/2018,  09/17/2018     Depression screen PHQ 2/9 06/23/2021 02/10/2021 06/10/2020 06/05/2019 06/18/2018  Decreased Interest 0 0 0 0 0  Down, Depressed, Hopeless 0 0 0 0 0  PHQ - 2 Score 0 0 0 0 0    No Known Allergies Social History   Social History Narrative   Marital status/children/pets: Married   Education/employment: B.S., Network engineer for [employer]   Safety:      -Wears a bicycle helmet riding a bike: Yes     -smoke alarm in the home:Yes     - wears seatbelt: Yes     - Feels safe in their relationships: Yes   Past Medical History:  Diagnosis Date   ALLERGIC RHINITIS 05/08/2007   Qualifier: Diagnosis of  By: Marca Ancona RMA, [name]     COVID-19 virus infection 10/2019   Frequent headaches    GLAUCOMA 04/01/2009   DR. Bryan at [facility] [place]   History of colon polyps    hyperplastic - Dr. Ardis Hughs   Hypertension    Past Surgical History:  Procedure Laterality Date   CYST REMOVAL NECK     ELECTROCARDIOGRAM  04/04/2007   SHOULDER ARTHROSCOPY WITH ROTATOR CUFF REPAIR AND SUBACROMIAL DECOMPRESSION  09/26/2012   Procedure: SHOULDER ARTHROSCOPY WITH ROTATOR CUFF REPAIR AND SUBACROMIAL DECOMPRESSION;  Surgeon: Yvette Rack., MD;  Location: [place];  Service: Orthopedics;  Laterality: Right;  RIGHT SHOULDER ARTHROSCOPY WITH EXTENSIVE DEBRIDEMENT, SUBACROMIAL DECOMPRESSION, PARTIAL ACROMIOPLASTY WITH CORACROMIAL RELEASE   Family History  Problem Relation Age of Onset   Leukemia Sister    Heart disease Father        CABG 60   Hypertension Father    Hyperlipidemia  Father    Prostate cancer Father 24   Colon cancer Maternal Aunt    Arthritis Mother    Hyperlipidemia Mother    Hypertension Mother    Arthritis Maternal Grandmother    Breast cancer Maternal Grandmother    Stroke Maternal Grandmother    Arthritis Maternal Grandfather    Arthritis Paternal Grandmother    Arthritis Paternal Grandfather    Heart disease Paternal Grandfather    Allergies as of 09/27/2021   No Known Allergies       Medication List        Accurate as of September 27, 2021 10:16 AM. If you have any questions, ask your nurse or doctor.          amLODipine 2.5 MG tablet Commonly known as: NORVASC Take 1 tablet (2.5 mg total) by mouth daily.   cetirizine 10 MG tablet Commonly known as: ZYRTEC Take 10 mg by mouth daily.   dorzolamide-timolol 22.3-6.8 MG/ML ophthalmic solution Commonly known as: COSOPT Place 1 drop into both eyes 2 (two) times daily.   Fish Oil 1000 MG Caps Take 3 capsules by mouth daily.   multivitamin tablet Take 1 tablet by mouth daily.   omeprazole 20 MG capsule Commonly known as: PRILOSEC TAKE 1 CAPSULE BY MOUTH DAILY   Travoprost (BAK Free) 0.004 % Soln ophthalmic solution Commonly known as: TRAVATAN Place 1 drop in each eye once a day at bedtime.        All past medical history, surgical history, allergies, family history, immunizations andmedications were updated in the EMR today and reviewed under the history and medication portions of their EMR.     ROS Negative, with the exception of above mentioned in HPI   Objective:  Wt 188 lb (85.3 kg)    BMI 24.80 kg/m  Body mass index is 24.8 kg/m.  Physical Exam Gen: Afebrile. No acute distress. Nontoxic in appearance, well developed, well nourished.  HENT: AT. [place]. Eyes:Pupils Equal Round Reactive to light, Extraocular movements intact,  Conjunctiva without redness, discharge or icterus. Chest: coughpresent, no shortness of breath Skin: no rashes, purpura or petechiae.  Neuro:  Alert. Oriented x3  No results found. No results found for this or any previous visit (from the past 24 hour(s)).  Assessment/Plan: Micheal Lawson is a 55 y.o. male present for OV for  COVID-19 Rest, hydrate.  +/- flonase, mucinex (DM if cough), nettie pot or nasal saline.  Paxlovid and tessalon perles prescribed, take paxlovid until completed.  Reviewed home care instructions for COVID. Advised self-isolation at home for  at least 5 days. After 5 days, if improved and fever resolved, can be in public, but should wear a mask around others for an additional 5 days. If symptoms, esp, dyspnea develops/worsens, recommend in-person evaluation at either an urgent care or the emergency room.  Reviewed expectations re: course of current medical issues. Discussed self-management of symptoms. Outlined signs and symptoms indicating need for more acute intervention. Patient verbalized understanding and all questions were answered. Patient received an After-Visit Summary.    No orders of the defined types were placed in this encounter.  No orders of the defined types were placed in this encounter.  Referral Orders  No referral(s) requested today     Note is dictated utilizing voice recognition software. Although note has been proof read prior to signing, occasional typographical errors still can be missed. If any questions arise, please do not hesitate to call for verification.   electronically signed by:  Howard Pouch, DO  [facility] - [place]

## 2021-10-13 ENCOUNTER — Other Ambulatory Visit (HOSPITAL_BASED_OUTPATIENT_CLINIC_OR_DEPARTMENT_OTHER): Payer: Self-pay

## 2021-10-20 ENCOUNTER — Other Ambulatory Visit (HOSPITAL_COMMUNITY): Payer: Self-pay

## 2021-10-23 ENCOUNTER — Other Ambulatory Visit (HOSPITAL_COMMUNITY): Payer: Self-pay

## 2021-11-13 DIAGNOSIS — H401122 Primary open-angle glaucoma, left eye, moderate stage: Secondary | ICD-10-CM | POA: Diagnosis not present

## 2021-11-13 DIAGNOSIS — H401111 Primary open-angle glaucoma, right eye, mild stage: Secondary | ICD-10-CM | POA: Diagnosis not present

## 2021-11-16 ENCOUNTER — Other Ambulatory Visit (HOSPITAL_BASED_OUTPATIENT_CLINIC_OR_DEPARTMENT_OTHER): Payer: Self-pay

## 2022-01-11 ENCOUNTER — Other Ambulatory Visit (HOSPITAL_BASED_OUTPATIENT_CLINIC_OR_DEPARTMENT_OTHER): Payer: Self-pay

## 2022-02-12 ENCOUNTER — Other Ambulatory Visit (HOSPITAL_BASED_OUTPATIENT_CLINIC_OR_DEPARTMENT_OTHER): Payer: Self-pay

## 2022-02-12 ENCOUNTER — Other Ambulatory Visit: Payer: Self-pay | Admitting: Family Medicine

## 2022-02-12 DIAGNOSIS — I10 Essential (primary) hypertension: Secondary | ICD-10-CM

## 2022-02-12 MED ORDER — AMLODIPINE BESYLATE 2.5 MG PO TABS
2.5000 mg | ORAL_TABLET | Freq: Every day | ORAL | 0 refills | Status: DC
  Filled 2022-02-12: qty 30, 30d supply, fill #0

## 2022-03-13 ENCOUNTER — Other Ambulatory Visit (HOSPITAL_BASED_OUTPATIENT_CLINIC_OR_DEPARTMENT_OTHER): Payer: Self-pay

## 2022-03-13 ENCOUNTER — Other Ambulatory Visit: Payer: Self-pay | Admitting: Family Medicine

## 2022-03-13 DIAGNOSIS — I10 Essential (primary) hypertension: Secondary | ICD-10-CM

## 2022-03-14 DIAGNOSIS — H401111 Primary open-angle glaucoma, right eye, mild stage: Secondary | ICD-10-CM | POA: Diagnosis not present

## 2022-03-14 DIAGNOSIS — H5213 Myopia, bilateral: Secondary | ICD-10-CM | POA: Diagnosis not present

## 2022-03-14 DIAGNOSIS — Z135 Encounter for screening for eye and ear disorders: Secondary | ICD-10-CM | POA: Diagnosis not present

## 2022-03-14 DIAGNOSIS — H524 Presbyopia: Secondary | ICD-10-CM | POA: Diagnosis not present

## 2022-03-14 DIAGNOSIS — H401122 Primary open-angle glaucoma, left eye, moderate stage: Secondary | ICD-10-CM | POA: Diagnosis not present

## 2022-03-20 ENCOUNTER — Other Ambulatory Visit: Payer: Self-pay | Admitting: Family Medicine

## 2022-03-20 DIAGNOSIS — I10 Essential (primary) hypertension: Secondary | ICD-10-CM

## 2022-03-23 ENCOUNTER — Other Ambulatory Visit (HOSPITAL_COMMUNITY): Payer: Self-pay

## 2022-04-11 ENCOUNTER — Other Ambulatory Visit (HOSPITAL_COMMUNITY): Payer: Self-pay

## 2022-06-29 ENCOUNTER — Ambulatory Visit (INDEPENDENT_AMBULATORY_CARE_PROVIDER_SITE_OTHER): Payer: 59 | Admitting: Family Medicine

## 2022-06-29 ENCOUNTER — Other Ambulatory Visit (INDEPENDENT_AMBULATORY_CARE_PROVIDER_SITE_OTHER): Payer: 59

## 2022-06-29 DIAGNOSIS — Z79899 Other long term (current) drug therapy: Secondary | ICD-10-CM

## 2022-06-29 DIAGNOSIS — Z125 Encounter for screening for malignant neoplasm of prostate: Secondary | ICD-10-CM | POA: Diagnosis not present

## 2022-06-29 DIAGNOSIS — Z0000 Encounter for general adult medical examination without abnormal findings: Secondary | ICD-10-CM | POA: Diagnosis not present

## 2022-06-29 DIAGNOSIS — I10 Essential (primary) hypertension: Secondary | ICD-10-CM | POA: Diagnosis not present

## 2022-06-29 LAB — COMPREHENSIVE METABOLIC PANEL
ALT: 35 U/L (ref 0–53)
AST: 22 U/L (ref 0–37)
Albumin: 4.1 g/dL (ref 3.5–5.2)
Alkaline Phosphatase: 68 U/L (ref 39–117)
BUN: 16 mg/dL (ref 6–23)
CO2: 29 mEq/L (ref 19–32)
Calcium: 9.3 mg/dL (ref 8.4–10.5)
Chloride: 103 mEq/L (ref 96–112)
Creatinine, Ser: 1.11 mg/dL (ref 0.40–1.50)
GFR: 74.31 mL/min (ref >60.00)
Glucose, Bld: 90 mg/dL (ref 70–99)
Potassium: 4.3 mEq/L (ref 3.5–5.1)
Sodium: 139 mEq/L (ref 135–145)
Total Bilirubin: 1 mg/dL (ref 0.2–1.2)
Total Protein: 6.9 g/dL (ref 6.0–8.3)

## 2022-06-29 LAB — CBC
HCT: 44.3 % (ref 39.0–52.0)
Hemoglobin: 15.4 g/dL (ref 13.0–17.0)
MCHC: 34.7 g/dL (ref 30.0–36.0)
MCV: 90.8 fl (ref 78.0–100.0)
Platelets: 255 10*3/uL (ref 150.0–400.0)
RBC: 4.88 Mil/uL (ref 4.22–5.81)
RDW: 12.9 % (ref 11.5–15.5)
WBC: 5.1 10*3/uL (ref 4.0–10.5)

## 2022-06-29 LAB — LIPID PANEL
Cholesterol: 185 mg/dL (ref 0–200)
HDL: 46.6 mg/dL (ref >39.00)
NonHDL: 138.87
Total CHOL/HDL Ratio: 4
Triglycerides: 352 mg/dL — ABNORMAL HIGH (ref 0.0–149.0)
VLDL: 70.4 mg/dL — ABNORMAL HIGH (ref 0.0–40.0)

## 2022-06-29 LAB — TSH: TSH: 3.33 u[IU]/mL (ref 0.35–5.50)

## 2022-06-29 LAB — PSA: PSA: 0.94 ng/mL (ref 0.10–4.00)

## 2022-06-29 LAB — LDL CHOLESTEROL, DIRECT: Direct LDL: 100 mg/dL

## 2022-07-02 ENCOUNTER — Other Ambulatory Visit (HOSPITAL_BASED_OUTPATIENT_CLINIC_OR_DEPARTMENT_OTHER): Payer: Self-pay

## 2022-07-02 ENCOUNTER — Encounter: Payer: Self-pay | Admitting: Family Medicine

## 2022-07-02 ENCOUNTER — Ambulatory Visit (INDEPENDENT_AMBULATORY_CARE_PROVIDER_SITE_OTHER): Payer: 59 | Admitting: Family Medicine

## 2022-07-02 VITALS — BP 134/85 | HR 72 | Temp 98.4°F | Ht 73.0 in | Wt 194.0 lb

## 2022-07-02 DIAGNOSIS — I10 Essential (primary) hypertension: Secondary | ICD-10-CM | POA: Diagnosis not present

## 2022-07-02 DIAGNOSIS — Z0000 Encounter for general adult medical examination without abnormal findings: Secondary | ICD-10-CM | POA: Diagnosis not present

## 2022-07-02 DIAGNOSIS — E781 Pure hyperglyceridemia: Secondary | ICD-10-CM

## 2022-07-02 DIAGNOSIS — K219 Gastro-esophageal reflux disease without esophagitis: Secondary | ICD-10-CM

## 2022-07-02 DIAGNOSIS — Z23 Encounter for immunization: Secondary | ICD-10-CM | POA: Diagnosis not present

## 2022-07-02 LAB — HEMOGLOBIN A1C: Hgb A1c MFr Bld: 5.3 % (ref 4.6–6.5)

## 2022-07-02 MED ORDER — AMLODIPINE BESYLATE 2.5 MG PO TABS
2.5000 mg | ORAL_TABLET | Freq: Every day | ORAL | 1 refills | Status: DC
  Filled 2022-07-02: qty 90, 90d supply, fill #0
  Filled 2022-09-25 – 2022-09-27 (×2): qty 90, 90d supply, fill #1

## 2022-07-02 MED ORDER — AMLODIPINE BESYLATE 2.5 MG PO TABS
2.5000 mg | ORAL_TABLET | Freq: Every day | ORAL | 3 refills | Status: DC
  Filled 2022-12-14: qty 90, 90d supply, fill #0
  Filled 2023-03-24: qty 90, 90d supply, fill #1
  Filled 2023-06-19: qty 90, 90d supply, fill #2

## 2022-07-02 MED ORDER — OMEPRAZOLE 20 MG PO CPDR
20.0000 mg | DELAYED_RELEASE_CAPSULE | Freq: Every day | ORAL | 3 refills | Status: DC
  Filled 2022-07-02: qty 90, 90d supply, fill #0
  Filled 2023-01-15: qty 90, 90d supply, fill #1
  Filled 2023-04-15: qty 90, 90d supply, fill #2

## 2022-07-02 MED ORDER — OMEPRAZOLE 20 MG PO CPDR
20.0000 mg | DELAYED_RELEASE_CAPSULE | Freq: Every day | ORAL | 3 refills | Status: DC
  Filled 2022-10-18 – 2022-10-19 (×2): qty 90, 90d supply, fill #0
  Filled 2023-06-19: qty 90, 90d supply, fill #1

## 2022-07-02 NOTE — Progress Notes (Signed)
Patient ID: Micheal Lawson, male  DOB: [birth year], 56 y.o.   MRN: [id number] Patient Care Team    Relationship Specialty Notifications Start End  Ma Hillock, DO PCP - General Family Medicine  06/16/18   Milus Banister, MD Attending Physician Gastroenterology  06/18/18   Audie Box, MD Referring Physician Ophthalmology  06/18/18   Druscilla Brownie, MD Referring Physician Dermatology  06/05/19     Chief Complaint  Patient presents with   Annual Exam    Subjective:  Micheal Lawson is a 56 y.o. male present for CPE. All past medical history, surgical history, allergies, family history, immunizations, medications and social history were updated in the electronic medical record today. All recent labs, ED visits and hospitalizations within the last year were reviewed.  Health maintenance:  Colonoscopy: completed 06/2016, by Dr. Ardis Hughs- hyperplastic polyp x1. follow up 10 years (2027). Immunizations: tdap UTD 2018, Influenza given today(encouraged yearly), shingrix series completed.Covid series completed Infectious disease screening: HIV completed 2017 PSA: fhx in father (79) PSA yearly recommended.collected for cpe  Lab Results  Component Value Date   PSA 0.94 06/29/2022   PSA 1.20 06/23/2021   PSA 0.78 06/10/2020  , pt was counseled on prostate cancer screenings.  Assistive device: none Oxygen UXL:KGMW Patient has a Dental home. Hospitalizations/ED visits: reviewed   Gastroesophageal reflux disease without esophagitis Using PPI daily now- doing great. He had a couple flares since last visit and started daily- controlled.    Essential hypertension/hypertrig Pt reports compliance with amlodipine 2.'5mg'$ . Patient denies chest pain, shortness of breath, dizziness or lower extremity edema.   He did start fish oil 1000 mg.  Labs collected prior to appointment Exercise: routinely exercises. RF: HTN, FHX, hypertrig     09/27/2021   10:15 AM 06/23/2021     1:58 PM 02/10/2021    1:00 PM 06/10/2020    8:05 AM 06/05/2019    8:25 AM  Depression screen PHQ 2/9  Decreased Interest 0 0 0 0 0  Down, Depressed, Hopeless 0 0 0 0 0  PHQ - 2 Score 0 0 0 0 0       No data to display                  06/05/2019    8:45 AM 06/18/2018    8:53 AM  Fall Risk   Falls in the past year? 0 No  Number falls in past yr: 0   Injury with Fall? 0   Follow up Falls evaluation completed       Immunization History  Administered Date(s) Administered   Influenza Whole 12/27/2008   Influenza,inj,Quad PF,6+ Mos 06/10/2020, 06/23/2021, 07/02/2022   Influenza-Unspecified 07/03/2017   Moderna Sars-Covid-2 Vaccination 12/16/2020   PFIZER Comirnaty(Gray Top)Covid-19 Tri-Sucrose Vaccine 12/16/2020   PFIZER(Purple Top)SARS-COV-2 Vaccination 10/23/2019, 11/13/2019   Td 05/08/2001, 12/27/2008   Tdap 03/30/2017   Zoster Recombinat (Shingrix) 06/18/2018, 09/17/2018     Past Medical History:  Diagnosis Date   ALLERGIC RHINITIS 05/08/2007   Qualifier: Diagnosis of  By: Marca Ancona RMA, [name]     COVID-19 virus infection 10/2019   Frequent headaches    GLAUCOMA 04/01/2009   DR. Bryan at [facility] [place]   History of colon polyps    hyperplastic - Dr. Ardis Hughs   Hypertension    No Known Allergies Past Surgical History:  Procedure Laterality Date   CYST REMOVAL NECK     ELECTROCARDIOGRAM  04/04/2007   SHOULDER ARTHROSCOPY WITH  ROTATOR CUFF REPAIR AND SUBACROMIAL DECOMPRESSION  09/26/2012   Procedure: SHOULDER ARTHROSCOPY WITH ROTATOR CUFF REPAIR AND SUBACROMIAL DECOMPRESSION;  Surgeon: Yvette Rack., MD;  Location: [place];  Service: Orthopedics;  Laterality: Right;  RIGHT SHOULDER ARTHROSCOPY WITH EXTENSIVE DEBRIDEMENT, SUBACROMIAL DECOMPRESSION, PARTIAL ACROMIOPLASTY WITH CORACROMIAL RELEASE   Family History  Problem Relation Age of Onset   Leukemia Sister    Heart disease Father        CABG 41   Hypertension Father    Hyperlipidemia Father    Prostate cancer  Father 73   Colon cancer Maternal Aunt    Arthritis Mother    Hyperlipidemia Mother    Hypertension Mother    Arthritis Maternal Grandmother    Breast cancer Maternal Grandmother    Stroke Maternal Grandmother    Arthritis Maternal Grandfather    Arthritis Paternal Grandmother    Arthritis Paternal Grandfather    Heart disease Paternal Grandfather    Social History   Social History Narrative   Marital status/children/pets: Married   Education/employment: B.S., Network engineer for [employer]   Safety:      -Wears a bicycle helmet riding a bike: Yes     -smoke alarm in the home:Yes     - wears seatbelt: Yes     - Feels safe in their relationships: Yes    Allergies as of 07/02/2022   No Known Allergies      Medication List        Accurate as of July 02, 2022  3:32 PM. If you have any questions, ask your nurse or doctor.          STOP taking these medications    benzonatate 100 MG capsule Commonly known as: Best boy Stopped by: Howard Pouch, DO   Fish Oil 1000 MG Caps Stopped by: Howard Pouch, DO       TAKE these medications    amLODipine 2.5 MG tablet Commonly known as: NORVASC Take 1 tablet (2.5 mg total) by mouth daily.   cetirizine 10 MG tablet Commonly known as: ZYRTEC Take 10 mg by mouth daily.   dorzolamide-timolol 22.3-6.8 MG/ML ophthalmic solution Commonly known as: COSOPT Place 1 drop into both eyes 2 (two) times daily.   multivitamin tablet Take 1 tablet by mouth daily.   omeprazole 20 MG capsule Commonly known as: PRILOSEC Take 1 capsule (20 mg total) by mouth daily. What changed: how much to take Changed by: Howard Pouch, DO   Travoprost (BAK Free) 0.004 % Soln ophthalmic solution Commonly known as: TRAVATAN Place 1 drop in each eye once a day at bedtime.       All past medical history, surgical history, allergies, family history, immunizations andmedications were updated in the EMR today and reviewed under the history and  medication portions of their EMR.     Recent Results (from the past 2160 hour(s))  CBC     Status: None   Collection Time: 06/29/22  8:07 AM  Result Value Ref Range   WBC 5.1 4.0 - 10.5 K/uL   RBC 4.88 4.22 - 5.81 Mil/uL   Platelets 255.0 150.0 - 400.0 K/uL   Hemoglobin 15.4 13.0 - 17.0 g/dL   HCT 44.3 39.0 - 52.0 %   MCV 90.8 78.0 - 100.0 fl   MCHC 34.7 30.0 - 36.0 g/dL   RDW 12.9 11.5 - 15.5 %  Comprehensive metabolic panel     Status: None   Collection Time: 06/29/22  8:07 AM  Result Value Ref  Range   Sodium 139 135 - 145 mEq/L   Potassium 4.3 3.5 - 5.1 mEq/L   Chloride 103 96 - 112 mEq/L   CO2 29 19 - 32 mEq/L   Glucose, Bld 90 70 - 99 mg/dL   BUN 16 6 - 23 mg/dL   Creatinine, Ser 1.11 0.40 - 1.50 mg/dL   Total Bilirubin 1.0 0.2 - 1.2 mg/dL   Alkaline Phosphatase 68 39 - 117 U/L   AST 22 0 - 37 U/L   ALT 35 0 - 53 U/L   Total Protein 6.9 6.0 - 8.3 g/dL   Albumin 4.1 3.5 - 5.2 g/dL   GFR 74.31 >60.00 mL/min    Comment: Calculated using the CKD-EPI Creatinine Equation (2021)   Calcium 9.3 8.4 - 10.5 mg/dL  Lipid panel     Status: Abnormal   Collection Time: 06/29/22  8:07 AM  Result Value Ref Range   Cholesterol 185 0 - 200 mg/dL    Comment: ATP III Classification       Desirable:  < 200 mg/dL               Borderline High:  200 - 239 mg/dL          High:  > = 240 mg/dL   Triglycerides 352.0 (H) 0.0 - 149.0 mg/dL    Comment: Normal:  <150 mg/dLBorderline High:  150 - 199 mg/dL   HDL 46.60 >39.00 mg/dL   VLDL 70.4 (H) 0.0 - 40.0 mg/dL   Total CHOL/HDL Ratio 4     Comment:                Men          Women1/2 Average Risk     3.4          3.3Average Risk          5.0          4.42X Average Risk          9.6          7.13X Average Risk          15.0          11.0                       NonHDL 138.87     Comment: NOTE:  Non-HDL goal should be 30 mg/dL higher than patient's LDL goal (i.e. LDL goal of < 70 mg/dL, would have non-HDL goal of < 100 mg/dL)  TSH     Status: None    Collection Time: 06/29/22  8:07 AM  Result Value Ref Range   TSH 3.33 0.35 - 5.50 uIU/mL  PSA     Status: None   Collection Time: 06/29/22  8:07 AM  Result Value Ref Range   PSA 0.94 0.10 - 4.00 ng/mL    Comment: Test performed using Access Hybritech PSA Assay, a parmagnetic partical, chemiluminecent immunoassay.  LDL cholesterol, direct     Status: None   Collection Time: 06/29/22  8:07 AM  Result Value Ref Range   Direct LDL 100.0 mg/dL    Comment: Optimal:  <100 mg/dLNear or Above Optimal:  100-129 mg/dLBorderline High:  130-159 mg/dLHigh:  160-189 mg/dLVery High:  >190 mg/dL      ROS 14 pt review of systems performed and negative (unless mentioned in an HPI)  Objective: BP 134/85   Pulse 72   Temp 98.4 F (36.9 C) (Oral)   Ht  $'6\' 1"'g$  (1.854 m)   Wt 194 lb (88 kg)   SpO2 96%   BMI 25.60 kg/m  Physical Exam Constitutional:      General: He is not in acute distress.    Appearance: Normal appearance. He is not ill-appearing, toxic-appearing or diaphoretic.  HENT:     Head: Normocephalic and atraumatic.     Right Ear: Tympanic membrane, ear canal and external ear normal. There is no impacted cerumen.     Left Ear: Tympanic membrane, ear canal and external ear normal. There is no impacted cerumen.     Nose: Nose normal. No congestion or rhinorrhea.     Mouth/Throat:     Mouth: Mucous membranes are moist.     Pharynx: Oropharynx is clear. No oropharyngeal exudate or posterior oropharyngeal erythema.  Eyes:     General: No scleral icterus.       Right eye: No discharge.        Left eye: No discharge.     Extraocular Movements: Extraocular movements intact.     Pupils: Pupils are equal, round, and reactive to light.  Cardiovascular:     Rate and Rhythm: Normal rate and regular rhythm.     Pulses: Normal pulses.     Heart sounds: Normal heart sounds. No murmur heard.    No friction rub. No gallop.  Pulmonary:     Effort: Pulmonary effort is normal. No respiratory  distress.     Breath sounds: Normal breath sounds. No stridor. No wheezing, rhonchi or rales.  Chest:     Chest wall: No tenderness.  Abdominal:     General: Abdomen is flat. Bowel sounds are normal. There is no distension.     Palpations: Abdomen is soft. There is no mass.     Tenderness: There is no abdominal tenderness. There is no right CVA tenderness, left CVA tenderness, guarding or rebound.     Hernia: No hernia is present.  Musculoskeletal:        General: No swelling or tenderness. Normal range of motion.     Cervical back: Normal range of motion and neck supple.     Right lower leg: No edema.     Left lower leg: No edema.  Lymphadenopathy:     Cervical: No cervical adenopathy.  Skin:    General: Skin is warm and dry.     Coloration: Skin is not jaundiced.     Findings: No bruising, lesion or rash.  Neurological:     General: No focal deficit present.     Mental Status: He is alert and oriented to person, place, and time. Mental status is at baseline.     Cranial Nerves: No cranial nerve deficit.     Sensory: No sensory deficit.     Motor: No weakness.     Coordination: Coordination normal.     Gait: Gait normal.     Deep Tendon Reflexes: Reflexes normal.  Psychiatric:        Mood and Affect: Mood normal.        Behavior: Behavior normal.        Thought Content: Thought content normal.        Judgment: Judgment normal.     No results found.  Assessment/plan: AWESOME JARED is a 56 y.o. male present for CPE Gastroesophageal reflux disease without esophagitis Stable Continue OTC PPI.  Essential hypertension/hypertrig Stable (when on medication) Continue amlodipine 2.5 mg daily CBC CMP TSH and lipids collected for appointment  Routine general  medical examination at a health care facility Colonoscopy: completed 06/2016, by Dr. Ardis Hughs- hyperplastic polyp x1. follow up 10 years (2027). Immunizations: tdap UTD 2018, Influenza UTD gets at work (encouraged  yearly), shingrix series completed.Covid series completed Infectious disease screening: HIV completed 2017 PSA: fhx in father (79) PSA yearly recommended.collected for cpe  Patient was encouraged to exercise greater than 150 minutes a week. Patient was encouraged to choose a diet filled with fresh fruits and vegetables, and lean meats. AVS provided to patient today for education/recommendation on gender specific health and safety maintenance.  Return in about 1 year (around 07/04/2023) for cpe (20 min), Routine chronic condition follow-up.   Orders Placed This Encounter  Procedures   Flu Vaccine QUAD 6+ mos PF IM (Fluarix Quad PF)   Meds ordered this encounter  Medications   DISCONTD: omeprazole (PRILOSEC) 20 MG capsule    Sig: Take 1 capsule (20 mg total) by mouth daily.    Dispense:  90 capsule    Refill:  3   DISCONTD: amLODipine (NORVASC) 2.5 MG tablet    Sig: Take 1 tablet (2.5 mg total) by mouth daily.    Dispense:  90 tablet    Refill:  1   amLODipine (NORVASC) 2.5 MG tablet    Sig: Take 1 tablet (2.5 mg total) by mouth daily.    Dispense:  90 tablet    Refill:  3   omeprazole (PRILOSEC) 20 MG capsule    Sig: Take 1 capsule (20 mg total) by mouth daily.    Dispense:  90 capsule    Refill:  3   Referral Orders  No referral(s) requested today     Note is dictated utilizing voice recognition software. Although note has been proof read prior to signing, occasional typographical errors still can be missed. If any questions arise, please do not hesitate to call for verification.  Electronically signed by: Howard Pouch, DO [place]

## 2022-07-02 NOTE — Progress Notes (Unsigned)
Provider canceled Prelabs ordered

## 2022-07-02 NOTE — Patient Instructions (Addendum)
Return in about 1 year (around 07/04/2023) for cpe (20 min), Routine chronic condition follow-up.        Great to see you today.  I have refilled the medication(s) we provide.   If labs were collected, we will inform you of lab results once received either by echart message or telephone call.   - echart message- for normal results that have been seen by the patient already.   - telephone call: abnormal results or if patient has not viewed results in their echart.  Health Maintenance, Male Adopting a healthy lifestyle and getting preventive care are important in promoting health and wellness. Ask your health care provider about: The right schedule for you to have regular tests and exams. Things you can do on your own to prevent diseases and keep yourself healthy. What should I know about diet, weight, and exercise? Eat a healthy diet  Eat a diet that includes plenty of vegetables, fruits, low-fat dairy products, and lean protein. Do not eat a lot of foods that are high in solid fats, added sugars, or sodium. Maintain a healthy weight Body mass index (BMI) is a measurement that can be used to identify possible weight problems. It estimates body fat based on height and weight. Your health care provider can help determine your BMI and help you achieve or maintain a healthy weight. Get regular exercise Get regular exercise. This is one of the most important things you can do for your health. Most adults should: Exercise for at least 150 minutes each week. The exercise should increase your heart rate and make you sweat (moderate-intensity exercise). Do strengthening exercises at least twice a week. This is in addition to the moderate-intensity exercise. Spend less time sitting. Even light physical activity can be beneficial. Watch cholesterol and blood lipids Have your blood tested for lipids and cholesterol at 56 years of age, then have this test every 5 years. You may need to have your  cholesterol levels checked more often if: Your lipid or cholesterol levels are high. You are older than 56 years of age. You are at high risk for heart disease. What should I know about cancer screening? Many types of cancers can be detected early and may often be prevented. Depending on your health history and family history, you may need to have cancer screening at various ages. This may include screening for: Colorectal cancer. Prostate cancer. Skin cancer. Lung cancer. What should I know about heart disease, diabetes, and high blood pressure? Blood pressure and heart disease High blood pressure causes heart disease and increases the risk of stroke. This is more likely to develop in people who have high blood pressure readings or are overweight. Talk with your health care provider about your target blood pressure readings. Have your blood pressure checked: Every 3-5 years if you are 52-34 years of age. Every year if you are 61 years old or older. If you are between the ages of 17 and 86 and are a current or former smoker, ask your health care provider if you should have a one-time screening for abdominal aortic aneurysm (AAA). Diabetes Have regular diabetes screenings. This checks your fasting blood sugar level. Have the screening done: Once every three years after age 54 if you are at a normal weight and have a low risk for diabetes. More often and at a younger age if you are overweight or have a high risk for diabetes. What should I know about preventing infection? Hepatitis B If you have  a higher risk for hepatitis B, you should be screened for this virus. Talk with your health care provider to find out if you are at risk for hepatitis B infection. Hepatitis C Blood testing is recommended for: Everyone born from [birth year] through 1965. Anyone with known risk factors for hepatitis C. Sexually transmitted infections (STIs) You should be screened each year for STIs, including gonorrhea  and chlamydia, if: You are sexually active and are younger than 56 years of age. You are older than 56 years of age and your health care provider tells you that you are at risk for this type of infection. Your sexual activity has changed since you were last screened, and you are at increased risk for chlamydia or gonorrhea. Ask your health care provider if you are at risk. Ask your health care provider about whether you are at high risk for HIV. Your health care provider may recommend a prescription medicine to help prevent HIV infection. If you choose to take medicine to prevent HIV, you should first get tested for HIV. You should then be tested every 3 months for as long as you are taking the medicine. Follow these instructions at home: Alcohol use Do not drink alcohol if your health care provider tells you not to drink. If you drink alcohol: Limit how much you have to 0-2 drinks a day. Know how much alcohol is in your drink. In the [place] one drink equals one 12 oz bottle of beer (355 mL), one 5 oz glass of wine (148 mL), or one 1 oz glass of hard liquor (44 mL). Lifestyle Do not use any products that contain nicotine or tobacco. These products include cigarettes, chewing tobacco, and vaping devices, such as e-cigarettes. If you need help quitting, ask your health care provider. Do not use street drugs. Do not share needles. Ask your health care provider for help if you need support or information about quitting drugs. General instructions Schedule regular health, dental, and eye exams. Stay current with your vaccines. Tell your health care provider if: You often feel depressed. You have ever been abused or do not feel safe at home. Summary Adopting a healthy lifestyle and getting preventive care are important in promoting health and wellness. Follow your health care provider's instructions about healthy diet, exercising, and getting tested or screened for diseases. Follow your health  care provider's instructions on monitoring your cholesterol and blood pressure. This information is not intended to replace advice given to you by your health care provider. Make sure you discuss any questions you have with your health care provider. Document Revised: 02/13/2021 Document Reviewed: 02/13/2021 Elsevier Patient Education  May.

## 2022-07-27 ENCOUNTER — Encounter: Payer: Self-pay | Admitting: Family Medicine

## 2022-09-12 DIAGNOSIS — H401111 Primary open-angle glaucoma, right eye, mild stage: Secondary | ICD-10-CM | POA: Diagnosis not present

## 2022-09-12 DIAGNOSIS — H401122 Primary open-angle glaucoma, left eye, moderate stage: Secondary | ICD-10-CM | POA: Diagnosis not present

## 2022-09-25 ENCOUNTER — Other Ambulatory Visit (HOSPITAL_COMMUNITY): Payer: Self-pay

## 2022-09-27 ENCOUNTER — Other Ambulatory Visit (HOSPITAL_COMMUNITY): Payer: Self-pay

## 2022-09-27 ENCOUNTER — Other Ambulatory Visit (HOSPITAL_BASED_OUTPATIENT_CLINIC_OR_DEPARTMENT_OTHER): Payer: Self-pay

## 2022-10-18 ENCOUNTER — Other Ambulatory Visit (HOSPITAL_COMMUNITY): Payer: Self-pay

## 2022-10-18 ENCOUNTER — Other Ambulatory Visit (HOSPITAL_BASED_OUTPATIENT_CLINIC_OR_DEPARTMENT_OTHER): Payer: Self-pay

## 2022-10-19 ENCOUNTER — Other Ambulatory Visit (HOSPITAL_BASED_OUTPATIENT_CLINIC_OR_DEPARTMENT_OTHER): Payer: Self-pay

## 2022-10-19 ENCOUNTER — Other Ambulatory Visit: Payer: Self-pay

## 2022-12-14 ENCOUNTER — Other Ambulatory Visit: Payer: Self-pay

## 2023-01-15 ENCOUNTER — Other Ambulatory Visit (HOSPITAL_COMMUNITY): Payer: Self-pay

## 2023-03-24 ENCOUNTER — Other Ambulatory Visit (HOSPITAL_BASED_OUTPATIENT_CLINIC_OR_DEPARTMENT_OTHER): Payer: Self-pay

## 2023-04-15 ENCOUNTER — Other Ambulatory Visit (HOSPITAL_COMMUNITY): Payer: Self-pay

## 2023-04-25 ENCOUNTER — Other Ambulatory Visit: Payer: Self-pay | Admitting: Oncology

## 2023-04-25 DIAGNOSIS — Z006 Encounter for examination for normal comparison and control in clinical research program: Secondary | ICD-10-CM

## 2023-06-19 ENCOUNTER — Other Ambulatory Visit (HOSPITAL_COMMUNITY): Payer: Self-pay

## 2023-06-19 ENCOUNTER — Other Ambulatory Visit: Payer: Self-pay

## 2023-07-08 ENCOUNTER — Other Ambulatory Visit (HOSPITAL_BASED_OUTPATIENT_CLINIC_OR_DEPARTMENT_OTHER): Payer: Self-pay

## 2023-07-08 ENCOUNTER — Ambulatory Visit (INDEPENDENT_AMBULATORY_CARE_PROVIDER_SITE_OTHER): Payer: 59 | Admitting: Family Medicine

## 2023-07-08 VITALS — BP 130/70 | HR 63 | Temp 98.4°F | Ht 74.0 in | Wt 193.2 lb

## 2023-07-08 DIAGNOSIS — Z131 Encounter for screening for diabetes mellitus: Secondary | ICD-10-CM

## 2023-07-08 DIAGNOSIS — Z0000 Encounter for general adult medical examination without abnormal findings: Secondary | ICD-10-CM

## 2023-07-08 DIAGNOSIS — I10 Essential (primary) hypertension: Secondary | ICD-10-CM | POA: Diagnosis not present

## 2023-07-08 DIAGNOSIS — Z125 Encounter for screening for malignant neoplasm of prostate: Secondary | ICD-10-CM

## 2023-07-08 DIAGNOSIS — Z23 Encounter for immunization: Secondary | ICD-10-CM

## 2023-07-08 DIAGNOSIS — E781 Pure hyperglyceridemia: Secondary | ICD-10-CM | POA: Diagnosis not present

## 2023-07-08 DIAGNOSIS — K219 Gastro-esophageal reflux disease without esophagitis: Secondary | ICD-10-CM

## 2023-07-08 DIAGNOSIS — G479 Sleep disorder, unspecified: Secondary | ICD-10-CM

## 2023-07-08 LAB — CBC
HCT: 47.2 % (ref 39.0–52.0)
Hemoglobin: 15.9 g/dL (ref 13.0–17.0)
MCHC: 33.7 g/dL (ref 30.0–36.0)
MCV: 91.4 fL (ref 78.0–100.0)
Platelets: 321 10*3/uL (ref 150.0–400.0)
RBC: 5.17 Mil/uL (ref 4.22–5.81)
RDW: 12.5 % (ref 11.5–15.5)
WBC: 5.4 10*3/uL (ref 4.0–10.5)

## 2023-07-08 LAB — COMPREHENSIVE METABOLIC PANEL
ALT: 31 U/L (ref 0–53)
AST: 18 U/L (ref 0–37)
Albumin: 4.4 g/dL (ref 3.5–5.2)
Alkaline Phosphatase: 75 U/L (ref 39–117)
BUN: 17 mg/dL (ref 6–23)
CO2: 28 meq/L (ref 19–32)
Calcium: 9.3 mg/dL (ref 8.4–10.5)
Chloride: 101 meq/L (ref 96–112)
Creatinine, Ser: 1.02 mg/dL (ref 0.40–1.50)
GFR: 81.66 mL/min (ref >60.00)
Glucose, Bld: 101 mg/dL — ABNORMAL HIGH (ref 70–99)
Potassium: 4.1 meq/L (ref 3.5–5.1)
Sodium: 137 meq/L (ref 135–145)
Total Bilirubin: 0.7 mg/dL (ref 0.2–1.2)
Total Protein: 7.1 g/dL (ref 6.0–8.3)

## 2023-07-08 LAB — LIPID PANEL
Cholesterol: 205 mg/dL — ABNORMAL HIGH (ref 0–200)
HDL: 48.6 mg/dL (ref >39.00)
Total CHOL/HDL Ratio: 4
Triglycerides: 451 mg/dL — ABNORMAL HIGH (ref 0.0–149.0)

## 2023-07-08 LAB — LDL CHOLESTEROL, DIRECT: Direct LDL: 107 mg/dL

## 2023-07-08 LAB — PSA: PSA: 1.36 ng/mL (ref 0.10–4.00)

## 2023-07-08 LAB — HEMOGLOBIN A1C: Hgb A1c MFr Bld: 5.1 % (ref 4.6–6.5)

## 2023-07-08 LAB — TSH: TSH: 2.93 u[IU]/mL (ref 0.35–5.50)

## 2023-07-08 MED ORDER — OMEPRAZOLE 20 MG PO CPDR
20.0000 mg | DELAYED_RELEASE_CAPSULE | Freq: Every day | ORAL | 3 refills | Status: DC
  Filled 2023-07-08: qty 90, 90d supply, fill #0
  Filled 2023-10-07: qty 90, 90d supply, fill #1
  Filled 2024-01-05: qty 90, 90d supply, fill #2
  Filled 2024-03-30: qty 90, 90d supply, fill #3

## 2023-07-08 MED ORDER — AMLODIPINE BESYLATE 2.5 MG PO TABS
2.5000 mg | ORAL_TABLET | Freq: Every day | ORAL | 3 refills | Status: DC
  Filled 2023-07-08 – 2023-10-07 (×2): qty 90, 90d supply, fill #0
  Filled 2024-01-03: qty 90, 90d supply, fill #1
  Filled 2024-03-30: qty 90, 90d supply, fill #2
  Filled 2024-06-21: qty 90, 90d supply, fill #3

## 2023-07-08 MED ORDER — TRAZODONE HCL 50 MG PO TABS
25.0000 mg | ORAL_TABLET | Freq: Every evening | ORAL | 2 refills | Status: AC | PRN
Start: 2023-07-08 — End: ?
  Filled 2023-07-08: qty 45, 30d supply, fill #0

## 2023-07-08 NOTE — Addendum Note (Signed)
Addended by: Leonie Douglas on: 07/08/2023 09:02 AM   Modules accepted: Orders

## 2023-07-08 NOTE — Progress Notes (Signed)
Patient ID: Micheal Lawson, male  DOB: [birth year], 57 y.o.   MRN: [id number] Patient Care Team    Relationship Specialty Notifications Start End  Natalia Leatherwood, DO PCP - General Family Medicine  06/16/18   Rachael Fee, MD Attending Physician Gastroenterology  06/18/18   Rudi Rummage, MD Referring Physician Ophthalmology  06/18/18   Cherlyn Roberts, MD Referring Physician Dermatology  06/05/19     Chief Complaint  Patient presents with   Annual Exam    Fasting CPE anf flu vaccine.    Subjective:  Micheal Lawson is a 57 y.o. male present for CPE. All past medical history, surgical history, allergies, family history, immunizations, medications and social history were updated in the electronic medical record today. All recent labs, ED visits and hospitalizations within the last year were reviewed.  Health maintenance:  Colonoscopy: completed 06/2016, by Dr. Christella Hartigan- hyperplastic polyp x1. follow up 10 years (2027). Immunizations: tdap UTD 2018, Influenza updated today(encouraged yearly), shingrix series completed.Covid series completed Infectious disease screening: HIV completed 2017, hep c completed PSA: fhx in father (79) PSA yearly recommended.collected for cpe  Lab Results  Component Value Date   PSA 0.94 06/29/2022   PSA 1.20 06/23/2021   PSA 0.78 06/10/2020  , pt was counseled on prostate cancer screenings.  Assistive device: none Oxygen [id number] Patient has a Dental home. Hospitalizations/ED visits: reviewed   Gastroesophageal reflux disease without esophagitis Using PPI daily now- doing great. He had a couple flares since last visit and started daily- controlled.    Essential hypertension/hypertrig Pt reports compliance with amlodipine 2.5mg .  Patient denies chest pain, shortness of breath, dizziness or lower extremity edema.  He did start fish oil 1000 mg.  Exercise: routinely exercises. RF: HTN, FHX, hypertrig     07/08/2023    8:35 AM  09/27/2021   10:15 AM 06/23/2021    1:58 PM 02/10/2021    1:00 PM 06/10/2020    8:05 AM  Depression screen PHQ 2/9  Decreased Interest 0 0 0 0 0  Down, Depressed, Hopeless 0 0 0 0 0  PHQ - 2 Score 0 0 0 0 0  Altered sleeping 3      Tired, decreased energy 0      Change in appetite 0      Feeling bad or failure about yourself  0      Trouble concentrating 0      Moving slowly or fidgety/restless 0      Suicidal thoughts 0      PHQ-9 Score 3      Difficult doing work/chores Somewhat difficult          07/08/2023    8:35 AM  GAD 7 : Generalized Anxiety Score  Nervous, Anxious, on Edge 1  Control/stop worrying 1  Worry too much - different things 1  Trouble relaxing 1  Restless 1  Easily annoyed or irritable 1  Afraid - awful might happen 0  Total GAD 7 Score 6  Anxiety Difficulty Somewhat difficult            07/08/2023    8:30 AM 06/05/2019    8:45 AM 06/18/2018    8:53 AM  Fall Risk   Falls in the past year? 1 0 No  Number falls in past yr: 0 0   Injury with Fall? 0 0   Risk for fall due to : History of fall(s)    Follow up Falls evaluation completed Falls  evaluation completed       Immunization History  Administered Date(s) Administered   Influenza Whole 12/27/2008   Influenza,inj,Quad PF,6+ Mos 06/10/2020, 06/23/2021, 07/02/2022   Influenza-Unspecified 07/03/2017   Moderna Sars-Covid-2 Vaccination 12/16/2020   PFIZER Comirnaty(Gray Top)Covid-19 Tri-Sucrose Vaccine 12/16/2020   PFIZER(Purple Top)SARS-COV-2 Vaccination 10/23/2019, 11/13/2019   Td 05/08/2001, 12/27/2008   Tdap 03/30/2017   Zoster Recombinant(Shingrix) 06/18/2018, 09/17/2018     Past Medical History:  Diagnosis Date   ALLERGIC RHINITIS 05/08/2007   Qualifier: Diagnosis of  By: Charlsie Quest RMA, [name]     COVID-19 virus infection 10/2019   Frequent headaches    GLAUCOMA 04/01/2009   DR. Bryan at [facility] [place]   History of colon polyps    hyperplastic - Dr. Christella Hartigan   Hypertension    No Known  Allergies Past Surgical History:  Procedure Laterality Date   CYST REMOVAL NECK     ELECTROCARDIOGRAM  04/04/2007   SHOULDER ARTHROSCOPY WITH ROTATOR CUFF REPAIR AND SUBACROMIAL DECOMPRESSION  09/26/2012   Procedure: SHOULDER ARTHROSCOPY WITH ROTATOR CUFF REPAIR AND SUBACROMIAL DECOMPRESSION;  Surgeon: Thera Flake., MD;  Location: [facility] OR;  Service: Orthopedics;  Laterality: Right;  RIGHT SHOULDER ARTHROSCOPY WITH EXTENSIVE DEBRIDEMENT, SUBACROMIAL DECOMPRESSION, PARTIAL ACROMIOPLASTY WITH CORACROMIAL RELEASE   Family History  Problem Relation Age of Onset   Leukemia Sister    Heart disease Father        CABG 81   Hypertension Father    Hyperlipidemia Father    Prostate cancer Father 42   Colon cancer Maternal Aunt    Arthritis Mother    Hyperlipidemia Mother    Hypertension Mother    Arthritis Maternal Grandmother    Breast cancer Maternal Grandmother    Stroke Maternal Grandmother    Arthritis Maternal Grandfather    Arthritis Paternal Grandmother    Arthritis Paternal Grandfather    Heart disease Paternal Grandfather    Social History   Social History Narrative   Marital status/children/pets: Married   Education/employment: B.S., Theatre stage manager for [employer]   Safety:      -Wears a bicycle helmet riding a bike: Yes     -smoke alarm in the home:Yes     - wears seatbelt: Yes     - Feels safe in their relationships: Yes    Allergies as of 07/08/2023   No Known Allergies      Medication List        Accurate as of July 08, 2023  8:51 AM. If you have any questions, ask your nurse or doctor.          STOP taking these medications    cetirizine 10 MG tablet Commonly known as: ZYRTEC Stopped by: Felix Pacini       TAKE these medications    amLODipine 2.5 MG tablet Commonly known as: NORVASC Take 1 tablet (2.5 mg total) by mouth daily. What changed: Another medication with the same name was removed. Continue taking this medication, and follow the directions  you see here. Changed by: Felix Pacini   dorzolamide-timolol 2-0.5 % ophthalmic solution Commonly known as: COSOPT Place 1 drop into both eyes 2 (two) times daily.   multivitamin tablet Take 1 tablet by mouth daily.   omeprazole 20 MG capsule Commonly known as: PRILOSEC Take 1 capsule (20 mg total) by mouth daily. What changed: Another medication with the same name was removed. Continue taking this medication, and follow the directions you see here. Changed by: Felix Pacini   Travoprost (BAK Free)  0.004 % Soln ophthalmic solution Commonly known as: TRAVATAN Place 1 drop in each eye once a day at bedtime.   traZODone 50 MG tablet Commonly known as: DESYREL Take 0.5-1.5 tablets (25-75 mg total) by mouth at bedtime as needed for sleep. Started by: Felix Pacini       All past medical history, surgical history, allergies, family history, immunizations andmedications were updated in the EMR today and reviewed under the history and medication portions of their EMR.     No results found for this or any previous visit (from the past 2160 hour(s)).     ROS 14 pt review of systems performed and negative (unless mentioned in an HPI)  Objective: BP 130/70   Pulse 63   Temp 98.4 F (36.9 C) (Oral)   Ht 6\' 2"  (1.88 m)   Wt 193 lb 3.2 oz (87.6 kg)   SpO2 97%   BMI 24.81 kg/m  Physical Exam Constitutional:      General: He is not in acute distress.    Appearance: Normal appearance. He is not ill-appearing, toxic-appearing or diaphoretic.  HENT:     Head: Normocephalic and atraumatic.     Right Ear: Tympanic membrane, ear canal and external ear normal. There is no impacted cerumen.     Left Ear: Tympanic membrane, ear canal and external ear normal. There is no impacted cerumen.     Nose: Nose normal. No congestion or rhinorrhea.     Mouth/Throat:     Mouth: Mucous membranes are moist.     Pharynx: Oropharynx is clear. No oropharyngeal exudate or posterior oropharyngeal  erythema.  Eyes:     General: No scleral icterus.       Right eye: No discharge.        Left eye: No discharge.     Extraocular Movements: Extraocular movements intact.     Pupils: Pupils are equal, round, and reactive to light.  Cardiovascular:     Rate and Rhythm: Normal rate and regular rhythm.     Pulses: Normal pulses.     Heart sounds: Normal heart sounds. No murmur heard.    No friction rub. No gallop.  Pulmonary:     Effort: Pulmonary effort is normal. No respiratory distress.     Breath sounds: Normal breath sounds. No stridor. No wheezing, rhonchi or rales.  Chest:     Chest wall: No tenderness.  Abdominal:     General: Abdomen is flat. Bowel sounds are normal. There is no distension.     Palpations: Abdomen is soft. There is no mass.     Tenderness: There is no abdominal tenderness. There is no right CVA tenderness, left CVA tenderness, guarding or rebound.     Hernia: No hernia is present.  Musculoskeletal:        General: No swelling or tenderness. Normal range of motion.     Cervical back: Normal range of motion and neck supple.     Right lower leg: No edema.     Left lower leg: No edema.  Lymphadenopathy:     Cervical: No cervical adenopathy.  Skin:    General: Skin is warm and dry.     Coloration: Skin is not jaundiced.     Findings: No bruising, lesion or rash.  Neurological:     General: No focal deficit present.     Mental Status: He is alert and oriented to person, place, and time. Mental status is at baseline.     Cranial Nerves: No  cranial nerve deficit.     Sensory: No sensory deficit.     Motor: No weakness.     Coordination: Coordination normal.     Gait: Gait normal.     Deep Tendon Reflexes: Reflexes normal.  Psychiatric:        Mood and Affect: Mood normal.        Behavior: Behavior normal.        Thought Content: Thought content normal.        Judgment: Judgment normal.     No results found.  Assessment/plan: Micheal Lawson is a 57  y.o. male present for CPE Gastroesophageal reflux disease without esophagitis Stable continue omeprazole as needed   Essential hypertension/hypertrig Stable Continue amlodipine 2.5 mg daily  Prostate cancer screening - PSA Influenza vaccine needed Influenza vaccine administered Diabetes mellitus screening - Hemoglobin A1c  Insomnia: Trial  of trazodone 25-75 mg.    Routine general medical examination at a health care facility Patient was encouraged to exercise greater than 150 minutes a week. Patient was encouraged to choose a diet filled with fresh fruits and vegetables, and lean meats. AVS provided to patient today for education/recommendation on gender specific health and safety maintenance. Colonoscopy: completed 06/2016, by Dr. Christella Hartigan- hyperplastic polyp x1. follow up 10 years (2027). Immunizations: tdap UTD 2018, Influenza administered today(encouraged yearly), shingrix series completed.Covid series completed Infectious disease screening: HIV completed 2017, hep c completed PSA: collected - Comprehensive metabolic panel - TSH - CBC  Return in about 1 year (around 07/08/2024) for cpe (20 min), Routine chronic condition follow-up.   Orders Placed This Encounter  Procedures   Comprehensive metabolic panel   Hemoglobin A1c   TSH   Lipid panel   PSA   CBC   Meds ordered this encounter  Medications   amLODipine (NORVASC) 2.5 MG tablet    Sig: Take 1 tablet (2.5 mg total) by mouth daily.    Dispense:  90 tablet    Refill:  3   omeprazole (PRILOSEC) 20 MG capsule    Sig: Take 1 capsule (20 mg total) by mouth daily.    Dispense:  90 capsule    Refill:  3   traZODone (DESYREL) 50 MG tablet    Sig: Take 0.5-1.5 tablets (25-75 mg total) by mouth at bedtime as needed for sleep.    Dispense:  45 tablet    Refill:  2   Referral Orders  No referral(s) requested today     Note is dictated utilizing voice recognition software. Although note has been proof read prior to  signing, occasional typographical errors still can be missed. If any questions arise, please do not hesitate to call for verification.  Electronically signed by: Felix Pacini, DO [facility] [place]

## 2023-07-08 NOTE — Patient Instructions (Addendum)
Return in about 1 year (around 07/08/2024) for cpe (20 min), Routine chronic condition follow-up.        Great to see you today.  I have refilled the medication(s) we provide.   If labs were collected or images ordered, we will inform you of  results once we have received them and reviewed. We will contact you either by echart message, or telephone call.  Please give ample time to the testing facility, and our office to run,  receive and review results. Please do not call inquiring of results, even if you can see them in your chart. We will contact you as soon as we are able. If it has been over 1 week since the test was completed, and you have not yet heard from [place], then please call us.    - echart message- for normal results that have been seen by the patient already.   - telephone call: abnormal results or if patient has not viewed results in their echart.  If a referral to a specialist was entered for you, please call us in 2 weeks if you have not heard from the specialist office to schedule.

## 2023-07-09 ENCOUNTER — Telehealth: Payer: Self-pay | Admitting: Family Medicine

## 2023-07-09 ENCOUNTER — Other Ambulatory Visit (HOSPITAL_BASED_OUTPATIENT_CLINIC_OR_DEPARTMENT_OTHER): Payer: Self-pay

## 2023-07-09 MED ORDER — FENOFIBRATE 145 MG PO TABS
145.0000 mg | ORAL_TABLET | Freq: Every day | ORAL | 3 refills | Status: DC
  Filled 2023-07-09: qty 90, 90d supply, fill #0
  Filled 2023-10-10: qty 90, 90d supply, fill #1
  Filled 2024-01-30: qty 90, 90d supply, fill #2
  Filled 2024-05-04: qty 90, 90d supply, fill #3

## 2023-07-09 NOTE — Telephone Encounter (Signed)
Spoke with patient regarding results/recommendations.  

## 2023-07-09 NOTE — Telephone Encounter (Signed)
Please call patient Liver, kidney and thyroid function are normal Blood cell counts and electrolytes are normal Diabetes screening/A1c is normal  PSA/prostate cancer screening is normal Cholesterol panel overall is considered at goal, except for triglycerides are extremely high at 451.    -Normal triglyceride levels are less than 150.  Elevated triglycerides at this level does place him at increased cardiac risk.  I do recommend we start a fiber based medicine called fenofibrate, also called Tricor which is taken nightly decrease his triglyceride levels.   -Recommend routine exercise, high fiber/low saturated fat diet.  Focus on intake of lean meats, fruits and vegetables.  Olive oil is best when requiring oil to be used.  Avoid butters.  Omega-3 to the diet can also be helpful.  Recommend he follow-up in 3 months with provider fasting, so that we can recheck to ensure his triglycerides are at goal.

## 2023-07-22 ENCOUNTER — Other Ambulatory Visit (HOSPITAL_COMMUNITY)
Admission: RE | Admit: 2023-07-22 | Discharge: 2023-07-22 | Disposition: A | Discharge status: Home / Self Care | Payer: 59 | Source: Ambulatory Visit | Attending: Oncology | Admitting: Oncology

## 2023-07-22 DIAGNOSIS — Z006 Encounter for examination for normal comparison and control in clinical research program: Secondary | ICD-10-CM | POA: Insufficient documentation

## 2023-08-01 LAB — HELIX MOLECULAR SCREEN: Genetic Analysis Overall Interpretation: NEGATIVE

## 2023-08-02 ENCOUNTER — Ambulatory Visit: Payer: 59

## 2023-08-02 ENCOUNTER — Encounter: Payer: Self-pay | Admitting: Family Medicine

## 2023-08-02 ENCOUNTER — Other Ambulatory Visit (HOSPITAL_BASED_OUTPATIENT_CLINIC_OR_DEPARTMENT_OTHER): Payer: Self-pay

## 2023-08-02 ENCOUNTER — Telehealth (INDEPENDENT_AMBULATORY_CARE_PROVIDER_SITE_OTHER): Payer: 59 | Admitting: Family Medicine

## 2023-08-02 DIAGNOSIS — L255 Unspecified contact dermatitis due to plants, except food: Secondary | ICD-10-CM

## 2023-08-02 DIAGNOSIS — R21 Rash and other nonspecific skin eruption: Secondary | ICD-10-CM | POA: Diagnosis not present

## 2023-08-02 MED ORDER — PREDNISONE 20 MG PO TABS
ORAL_TABLET | ORAL | 0 refills | Status: AC
  Filled 2023-08-02: qty 15, 9d supply, fill #0

## 2023-08-02 MED ORDER — METHYLPREDNISOLONE ACETATE 80 MG/ML IJ SUSP
80.0000 mg | Freq: Once | INTRAMUSCULAR | Status: AC
Start: 2023-08-02 — End: 2023-08-02
  Administered 2023-08-02: 80 mg via INTRAMUSCULAR

## 2023-08-02 NOTE — Progress Notes (Signed)
VIRTUAL VISIT VIA VIDEO  I connected with Micheal Lawson on 08/02/23 at  8:00 AM EDT by a video enabled telemedicine application and verified that I am speaking with the correct person using two identifiers. Location patient: Home Location provider: [facility], Office Persons participating in the virtual visit: Patient, Dr. Claiborne Billings and Ivonne Andrew, CMA  I discussed the limitations of evaluation and management by telemedicine and the availability of in person appointments. The patient expressed understanding and agreed to proceed.     Micheal Lawson , Jun 16, 1966, 57 y.o., male MRN: [id number] Patient Care Team    Relationship Specialty Notifications Start End  Natalia Leatherwood, DO PCP - General Family Medicine  06/16/18   Rachael Fee, MD Attending Physician Gastroenterology  06/18/18   Rudi Rummage, MD Referring Physician Ophthalmology  06/18/18   Cherlyn Roberts, MD Referring Physician Dermatology  06/05/19     Chief Complaint  Patient presents with   Rash    Face rash since Tuesday. Using cortisone cream     Subjective: Micheal Lawson is a 57 y.o. Pt presents for an OV with complaints of facial rash of 3 days duration.  Associated symptoms include itchiness and blistering spreading rash. He has been using cortisone cream. He was working out in the yard prior to onset. Rash is over nose, cheeks and surrounding eyes. .       07/08/2023    8:35 AM 09/27/2021   10:15 AM 06/23/2021    1:58 PM 02/10/2021    1:00 PM 06/10/2020    8:05 AM  Depression screen PHQ 2/9  Decreased Interest 0 0 0 0 0  Down, Depressed, Hopeless 0 0 0 0 0  PHQ - 2 Score 0 0 0 0 0  Altered sleeping 3      Tired, decreased energy 0      Change in appetite 0      Feeling bad or failure about yourself  0      Trouble concentrating 0      Moving slowly or fidgety/restless 0      Suicidal thoughts 0      PHQ-9 Score 3      Difficult doing work/chores Somewhat difficult         No Known Allergies Social History   Social History Narrative   Marital status/children/pets: Married   Education/employment: B.S., Theatre stage manager for [employer]   Safety:      -Wears a bicycle helmet riding a bike: Yes     -smoke alarm in the home:Yes     - wears seatbelt: Yes     - Feels safe in their relationships: Yes   Past Medical History:  Diagnosis Date   ALLERGIC RHINITIS 05/08/2007   Qualifier: Diagnosis of  By: Charlsie Quest RMA, [name]     COVID-19 virus infection 10/2019   Frequent headaches    GLAUCOMA 04/01/2009   DR. Bryan at [facility]   History of colon polyps    hyperplastic - Dr. Christella Hartigan   Hypertension    Past Surgical History:  Procedure Laterality Date   CYST REMOVAL NECK     ELECTROCARDIOGRAM  04/04/2007   SHOULDER ARTHROSCOPY WITH ROTATOR CUFF REPAIR AND SUBACROMIAL DECOMPRESSION  09/26/2012   Procedure: SHOULDER ARTHROSCOPY WITH ROTATOR CUFF REPAIR AND SUBACROMIAL DECOMPRESSION;  Surgeon: Thera Flake., MD;  Location: [facility] OR;  Service: Orthopedics;  Laterality: Right;  RIGHT SHOULDER ARTHROSCOPY WITH EXTENSIVE DEBRIDEMENT, SUBACROMIAL  DECOMPRESSION, PARTIAL ACROMIOPLASTY WITH CORACROMIAL RELEASE   Family History  Problem Relation Age of Onset   Leukemia Sister    Heart disease Father        CABG 61   Hypertension Father    Hyperlipidemia Father    Prostate cancer Father 73   Colon cancer Maternal Aunt    Arthritis Mother    Hyperlipidemia Mother    Hypertension Mother    Arthritis Maternal Grandmother    Breast cancer Maternal Grandmother    Stroke Maternal Grandmother    Arthritis Maternal Grandfather    Arthritis Paternal Grandmother    Arthritis Paternal Grandfather    Heart disease Paternal Grandfather    Allergies as of 08/02/2023   No Known Allergies      Medication List        Accurate as of August 02, 2023  8:04 AM. If you have any questions, ask your nurse or doctor.          amLODipine 2.5 MG tablet Commonly known as:  NORVASC Take 1 tablet (2.5 mg total) by mouth daily.   dorzolamide-timolol 2-0.5 % ophthalmic solution Commonly known as: COSOPT Place 1 drop into both eyes 2 (two) times daily.   fenofibrate 145 MG tablet Commonly known as: Tricor Take 1 tablet (145 mg total) by mouth daily.   multivitamin tablet Take 1 tablet by mouth daily.   omeprazole 20 MG capsule Commonly known as: PRILOSEC Take 1 capsule (20 mg total) by mouth daily.   predniSONE 20 MG tablet Commonly known as: DELTASONE Take 3 tablets (60 mg total) by mouth daily for 2 days, THEN 2 tablets (40 mg total) daily for 3 days, THEN 1 tablet (20 mg total) daily for 2 days, THEN 0.5 tablets (10 mg total) daily for 2 days. Start taking on: August 03, 2023 Started by: Felix Pacini   Travoprost (BAK Free) 0.004 % Soln ophthalmic solution Commonly known as: TRAVATAN Place 1 drop in each eye once a day at bedtime.   traZODone 50 MG tablet Commonly known as: DESYREL Take 0.5-1.5 tablets (25-75 mg total) by mouth at bedtime as needed for sleep.        All past medical history, surgical history, allergies, family history, immunizations andmedications were updated in the EMR today and reviewed under the history and medication portions of their EMR.     ROS Negative, with the exception of above mentioned in HPI   Objective:  There were no vitals taken for this visit. There is no height or weight on file to calculate BMI. Physical Exam Vitals and nursing note reviewed.  Constitutional:      General: He is not in acute distress.    Appearance: Normal appearance. He is not ill-appearing, toxic-appearing or diaphoretic.  HENT:     Head: Normocephalic and atraumatic.  Eyes:     General: No scleral icterus.       Right eye: No discharge.        Left eye: No discharge.     Extraocular Movements: Extraocular movements intact.     Pupils: Pupils are equal, round, and reactive to light.  Pulmonary:     Effort: Pulmonary  effort is normal.  Skin:    General: Skin is warm and dry.     Coloration: Skin is not jaundiced or pale.     Findings: Rash present.     Comments: Vesicular rash across cheeks, nose and surrounding eyes.   Neurological:     Mental Status:  He is alert and oriented to person, place, and time. Mental status is at baseline.  Psychiatric:        Mood and Affect: Mood normal.        Behavior: Behavior normal.        Thought Content: Thought content normal.        Judgment: Judgment normal.      No results found. No results found. No results found for this or any previous visit (from the past 24 hour(s)).  Assessment/Plan: Micheal Lawson is a 57 y.o. male present for OV for  Facial rash/Plant dermatitis Spreading plant dermatitis of face, now spreading to around eyes.  He will be called back and scheduled for nurse visit today ASAP for IM depo medrol inj 80 mg Start prednisone taper tomorrow for additional 9 days.  Continue OTC cortisone and or benadryl gel, sparing eyes . If mouth/throat or tongue swelling occurs> ED recommended F/u PRN   Reviewed expectations re: course of current medical issues. Discussed self-management of symptoms. Outlined signs and symptoms indicating need for more acute intervention. Patient verbalized understanding and all questions were answered. Patient received an After-Visit Summary.    No orders of the defined types were placed in this encounter.  Meds ordered this encounter  Medications   predniSONE (DELTASONE) 20 MG tablet    Sig: Take 3 tablets (60 mg total) by mouth daily for 2 days, THEN 2 tablets (40 mg total) daily for 3 days, THEN 1 tablet (20 mg total) daily for 2 days, THEN 0.5 tablets (10 mg total) daily for 2 days.    Dispense:  15 tablet    Refill:  0   Referral Orders  No referral(s) requested today     Note is dictated utilizing voice recognition software. Although note has been proof read prior to signing, occasional  typographical errors still can be missed. If any questions arise, please do not hesitate to call for verification.   electronically signed by:  Felix Pacini, DO  [facility] - OR

## 2023-08-02 NOTE — Patient Instructions (Addendum)

## 2023-08-02 NOTE — Progress Notes (Addendum)
Pt came for depo 80 injection after virtual visit

## 2023-09-04 ENCOUNTER — Ambulatory Visit
Admission: RE | Admit: 2023-09-04 | Discharge: 2023-09-04 | Disposition: A | Discharge status: Home / Self Care | Payer: 59 | Source: Ambulatory Visit | Attending: Family Medicine | Admitting: Family Medicine

## 2023-09-04 VITALS — BP 143/85 | HR 67 | Temp 97.7°F | Resp 18 | Ht 73.0 in | Wt 183.0 lb

## 2023-09-04 DIAGNOSIS — J209 Acute bronchitis, unspecified: Secondary | ICD-10-CM | POA: Diagnosis not present

## 2023-09-04 MED ORDER — AZITHROMYCIN 250 MG PO TABS: ORAL_TABLET | ORAL | 0 refills | Status: DC

## 2023-09-04 NOTE — ED Triage Notes (Signed)
Patient c/o chest congestion x 10 days, productive cough, denies any nasal drainage, some fatigue.  Patient denies any OTC cold meds.

## 2023-09-04 NOTE — Discharge Instructions (Signed)
Fill and take the Z-Pak if you fail to improve as we discussed  Continue to drink lots of fluids.  May take over-the-counter cough or cold medicines as needed  Call for questions or problems

## 2023-09-04 NOTE — ED Provider Notes (Signed)
[facility]    CSN: [id number] Arrival date & time: 09/04/23  5366      History   Chief Complaint Chief Complaint  Patient presents with   Cough    And fluid build up - Entered by patient    HPI Micheal Lawson is Lawson 57 y.o. male.   HPI  Patient is healthy.  In good physical condition with regular exercise.  No underlying lung disease or asthma.  Has had some chest congestion and chest symptoms for about 10 days.  Cough the last 3 days that is getting worse.  Cough that is productive.  No chest pain.  No fatigue.  No fevers.  Past Medical History:  Diagnosis Date   ALLERGIC RHINITIS 05/08/2007   Qualifier: Diagnosis of  By: Micheal Lawson RMA, Micheal Lawson     COVID-19 virus infection 10/2019   Frequent headaches    GLAUCOMA 04/01/2009   DR. Bryan at [facility] [place]   History of colon polyps    hyperplastic - Micheal Lawson   Hypertension     Patient Active Problem List   Diagnosis Date Noted   Skin lesion of left leg 06/05/2019   Skin lesion of scalp 06/05/2019   Skin lesion of back 06/05/2019   Chronic midline low back pain without sciatica 06/05/2019   Hypertriglyceridemia 06/19/2018   Essential hypertension 06/18/2018   GERD (gastroesophageal reflux disease) 06/08/2014   GLAUCOMA 04/01/2009    Past Surgical History:  Procedure Laterality Date   CYST REMOVAL NECK     ELECTROCARDIOGRAM  04/04/2007   SHOULDER ARTHROSCOPY WITH ROTATOR CUFF REPAIR AND SUBACROMIAL DECOMPRESSION  09/26/2012   Procedure: SHOULDER ARTHROSCOPY WITH ROTATOR CUFF REPAIR AND SUBACROMIAL DECOMPRESSION;  Surgeon: Micheal Lawson., Micheal Lawson;  Location: [facility] OR;  Service: Orthopedics;  Laterality: Right;  RIGHT SHOULDER ARTHROSCOPY WITH EXTENSIVE DEBRIDEMENT, SUBACROMIAL DECOMPRESSION, PARTIAL ACROMIOPLASTY WITH CORACROMIAL RELEASE       Home Medications    Prior to Admission medications   Medication Sig Start Date End Date Taking? Authorizing Provider  amLODipine (NORVASC) 2.5 MG tablet Take 1  tablet (2.5 mg total) by mouth daily. 07/08/23 01/04/24 Yes Kuneff, Micheal Lawson, Micheal Lawson  azithromycin (ZITHROMAX Z-PAK) 250 MG tablet Take two pills today followed by one Lawson day until gone 09/04/23  Yes Micheal Moore, Micheal Lawson  dorzolamide-timolol (COSOPT) 22.3-6.8 MG/ML ophthalmic solution Place 1 drop into both eyes 2 (two) times daily.   Yes Provider, Historical, Micheal Lawson  fenofibrate (TRICOR) 145 MG tablet Take 1 tablet (145 mg total) by mouth daily. 07/09/23  Yes Kuneff, Micheal Lawson, Micheal Lawson  Multiple Vitamin (MULTIVITAMIN) tablet Take 1 tablet by mouth daily.   Yes Provider, Historical, Micheal Lawson  omeprazole (PRILOSEC) 20 MG capsule Take 1 capsule (20 mg total) by mouth daily. 07/08/23 07/07/24 Yes Kuneff, Micheal Lawson, Micheal Lawson  Travoprost, BAK Free, (TRAVATAN) 0.004 % SOLN ophthalmic solution Place 1 drop in each eye once Lawson day at bedtime. 03/08/21  Yes   traZODone (DESYREL) 50 MG tablet Take 0.5-1.5 tablets (25-75 mg total) by mouth at bedtime as needed for sleep. 07/08/23  Yes Kuneff, Micheal Lawson, Micheal Lawson    Family History Family History  Problem Relation Age of Onset   Leukemia Sister    Heart disease Father        CABG 2   Hypertension Father    Hyperlipidemia Father    Prostate cancer Father 24   Colon cancer Maternal Aunt    Arthritis Mother    Hyperlipidemia Mother  Hypertension Mother    Arthritis Maternal Grandmother    Breast cancer Maternal Grandmother    Stroke Maternal Grandmother    Arthritis Maternal Grandfather    Arthritis Paternal Grandmother    Arthritis Paternal Grandfather    Heart disease Paternal Grandfather     Social History Social History   Tobacco Use   Smoking status: Never   Smokeless tobacco: Never  Vaping Use   Vaping status: Never Used  Substance Use Topics   Alcohol use: Yes    Alcohol/week: 2.0 standard drinks of alcohol    Types: 2 Cans of beer per week    Comment: occ   Drug use: No     Allergies   Patient has no known allergies.   Review of Systems Review of Systems  See  HPI Physical Exam Triage Vital Signs ED Triage Vitals  Encounter Vitals Group     BP 09/04/23 0943 (!) 143/85     Systolic BP Percentile --      Diastolic BP Percentile --      Pulse Rate 09/04/23 0943 67     Resp 09/04/23 0943 18     Temp 09/04/23 0943 97.7 F (36.5 C)     Temp Source 09/04/23 0943 Oral     SpO2 09/04/23 0943 97 %     Weight 09/04/23 0945 183 lb (83 kg)     Height 09/04/23 0945 6\' 1"  (1.854 m)     Head Circumference --      Peak Flow --      Pain Score 09/04/23 0945 0     Pain Loc --      Pain Education --      Exclude from Growth Chart --    No data found.  Updated Vital Signs BP (!) 143/85 (BP Location: Right Arm)   Pulse 67   Temp 97.7 F (36.5 C) (Oral)   Resp 18   Ht 6\' 1"  (1.854 m)   Wt 83 kg   SpO2 97%   BMI 24.14 kg/m      Physical Exam Constitutional:      General: He is not in acute distress.    Appearance: He is well-developed.  HENT:     Head: Normocephalic and atraumatic.  Eyes:     Conjunctiva/sclera: Conjunctivae normal.     Pupils: Pupils are equal, round, and reactive to light.  Cardiovascular:     Rate and Rhythm: Regular rhythm. Bradycardia present.  Pulmonary:     Effort: Pulmonary effort is normal. No respiratory distress.     Breath sounds: Normal breath sounds. No rhonchi or rales.  Abdominal:     General: There is no distension.     Palpations: Abdomen is soft.  Musculoskeletal:        General: Normal range of motion.     Cervical back: Normal range of motion.  Skin:    General: Skin is warm and dry.  Neurological:     Mental Status: He is alert.      UC Treatments / Results  Labs (all labs ordered are listed, but only abnormal results are displayed) Labs Reviewed - No data to display  EKG   Radiology No results found.  Procedures Procedures (including critical care time)  Medications Ordered in UC Medications - No data to display  Initial Impression / Assessment and Plan / UC Course  I have  reviewed the triage vital signs and the nursing notes.  Pertinent labs & imaging results that were  available during my care of the patient were reviewed by me and considered in my medical decision making (see chart for details).     I reviewed with patient that most bronchitis is caused by Lawson virus.  I believe he can get by without an antibiotic at this time.  It is Lawson holiday weekend (Thanksgiving) I will give him Lawson prescription for Lawson Z-Pak if he fails to improve or see significantly worsening symptoms. Final Clinical Impressions(s) / UC Diagnoses   Final diagnoses:  Acute bronchitis, unspecified organism     Discharge Instructions      Fill and take the Z-Pak if you fail to improve as we discussed  Continue to drink lots of fluids.  May take over-the-counter cough or cold medicines as needed  Call for questions or problems   ED Prescriptions     Medication Sig Dispense Auth. Provider   azithromycin (ZITHROMAX Z-PAK) 250 MG tablet Take two pills today followed by one Lawson day until gone 6 tablet Delton See Letta Pate, Micheal Lawson      PDMP not reviewed this encounter.   Micheal Moore, Micheal Lawson 09/04/23 1006

## 2023-09-19 DIAGNOSIS — H401111 Primary open-angle glaucoma, right eye, mild stage: Secondary | ICD-10-CM | POA: Diagnosis not present

## 2023-09-19 DIAGNOSIS — H401122 Primary open-angle glaucoma, left eye, moderate stage: Secondary | ICD-10-CM | POA: Diagnosis not present

## 2023-10-07 ENCOUNTER — Other Ambulatory Visit: Payer: Self-pay

## 2023-10-10 ENCOUNTER — Other Ambulatory Visit (HOSPITAL_COMMUNITY): Payer: Self-pay

## 2023-10-10 ENCOUNTER — Ambulatory Visit: Payer: 59 | Admitting: Family Medicine

## 2023-10-10 ENCOUNTER — Encounter: Payer: Self-pay | Admitting: Family Medicine

## 2023-10-10 VITALS — BP 136/88 | HR 63 | Temp 97.8°F | Wt 187.4 lb

## 2023-10-10 DIAGNOSIS — K219 Gastro-esophageal reflux disease without esophagitis: Secondary | ICD-10-CM

## 2023-10-10 DIAGNOSIS — G479 Sleep disorder, unspecified: Secondary | ICD-10-CM

## 2023-10-10 DIAGNOSIS — I10 Essential (primary) hypertension: Secondary | ICD-10-CM | POA: Diagnosis not present

## 2023-10-10 DIAGNOSIS — E781 Pure hyperglyceridemia: Secondary | ICD-10-CM | POA: Diagnosis not present

## 2023-10-10 LAB — COMPREHENSIVE METABOLIC PANEL
ALT: 26 U/L (ref 0–53)
AST: 22 U/L (ref 0–37)
Albumin: 4.6 g/dL (ref 3.5–5.2)
Alkaline Phosphatase: 59 U/L (ref 39–117)
BUN: 15 mg/dL (ref 6–23)
CO2: 27 meq/L (ref 19–32)
Calcium: 9.6 mg/dL (ref 8.4–10.5)
Chloride: 103 meq/L (ref 96–112)
Creatinine, Ser: 1.12 mg/dL (ref 0.40–1.50)
GFR: 72.86 mL/min (ref >60.00)
Glucose, Bld: 105 mg/dL — ABNORMAL HIGH (ref 70–99)
Potassium: 4.1 meq/L (ref 3.5–5.1)
Sodium: 139 meq/L (ref 135–145)
Total Bilirubin: 0.7 mg/dL (ref 0.2–1.2)
Total Protein: 6.9 g/dL (ref 6.0–8.3)

## 2023-10-10 LAB — LIPID PANEL
Cholesterol: 180 mg/dL (ref 0–200)
HDL: 60.6 mg/dL (ref >39.00)
LDL Cholesterol: 90 mg/dL (ref 0–99)
NonHDL: 119.08
Total CHOL/HDL Ratio: 3
Triglycerides: 143 mg/dL (ref 0.0–149.0)
VLDL: 28.6 mg/dL (ref 0.0–40.0)

## 2023-10-10 NOTE — Patient Instructions (Addendum)

## 2023-10-10 NOTE — Progress Notes (Signed)
 Patient ID: Micheal Lawson, male  DOB: [birth year], 58 y.o.   MRN: [id number] Patient Care Team    Relationship Specialty Notifications Start End  Catherine Charlies LABOR, DO PCP - General Family Medicine  06/16/18   Teressa Toribio SQUIBB, MD Attending Physician Gastroenterology  06/18/18   Dorise Lynwood Marsa DOUGLAS, MD Referring Physician Ophthalmology  06/18/18   Ivin Kocher, MD Referring Physician Dermatology  06/05/19     Chief Complaint  Patient presents with   Hyperlipidemia    Subjective:  Micheal Lawson is a 58 y.o. male present for chronic condition management appointment All past medical history, surgical history, allergies, family history, immunizations, medications and social history were updated in the electronic medical record today. All recent labs, ED visits and hospitalizations within the last year were reviewed.   Gastroesophageal reflux disease without esophagitis Using PPI daily now- doing great. He had a couple flares since last visit and started daily- controlled.    Essential hypertension/hypertrig Pt reports compliance with amlodipine  2.5mg  and fenofibrate  for elevated triglycerides.  Fenofibrate  was started 3 months ago.  He is fasting and has returned for recheck on triglycerides today Patient denies chest pain, shortness of breath, dizziness or lower extremity edema.  He did start fish oil 1000 mg.  Exercise: routinely exercises. RF: HTN, FHX, hypertrig     07/08/2023    8:35 AM 09/27/2021   10:15 AM 06/23/2021    1:58 PM 02/10/2021    1:00 PM 06/10/2020    8:05 AM  Depression screen PHQ 2/9  Decreased Interest 0 0 0 0 0  Down, Depressed, Hopeless 0 0 0 0 0  PHQ - 2 Score 0 0 0 0 0  Altered sleeping 3      Tired, decreased energy 0      Change in appetite 0      Feeling bad or failure about yourself  0      Trouble concentrating 0      Moving slowly or fidgety/restless 0      Suicidal thoughts 0      PHQ-9 Score 3      Difficult doing work/chores  Somewhat difficult          07/08/2023    8:35 AM  GAD 7 : Generalized Anxiety Score  Nervous, Anxious, on Edge 1  Control/stop worrying 1  Worry too much - different things 1  Trouble relaxing 1  Restless 1  Easily annoyed or irritable 1  Afraid - awful might happen 0  Total GAD 7 Score 6  Anxiety Difficulty Somewhat difficult            10/10/2023    8:20 AM 07/08/2023    8:30 AM 06/05/2019    8:45 AM 06/18/2018    8:53 AM  Fall Risk   Falls in the past year? 0 1 0 No  Number falls in past yr: 0 0 0   Injury with Fall? 0 0 0   Risk for fall due to : No Fall Risks History of fall(s)    Follow up Falls evaluation completed Falls evaluation completed Falls evaluation completed       Immunization History  Administered Date(s) Administered   Influenza Whole 12/27/2008   Influenza, Seasonal, Injecte, Preservative Fre 07/08/2023   Influenza,inj,Quad PF,6+ Mos 06/10/2020, 06/23/2021, 07/02/2022   Influenza-Unspecified 07/03/2017   Moderna Sars-Covid-2 Vaccination 12/16/2020   PFIZER Comirnaty(Gray Top)Covid-19 Tri-Sucrose Vaccine 12/16/2020   PFIZER(Purple Top)SARS-COV-2 Vaccination 10/23/2019, 11/13/2019   Td  05/08/2001, 12/27/2008   Tdap 03/30/2017   Zoster Recombinant(Shingrix ) 06/18/2018, 09/17/2018     Past Medical History:  Diagnosis Date   ALLERGIC RHINITIS 05/08/2007   Qualifier: Diagnosis of  By: Wilhemina RMA, [name]     COVID-19 virus infection 10/2019   Frequent headaches    GLAUCOMA 04/01/2009   DR. Bryan at [facility] [place]   History of colon polyps    hyperplastic - Dr. Teressa   Hypertension    No Known Allergies Past Surgical History:  Procedure Laterality Date   CYST REMOVAL NECK     ELECTROCARDIOGRAM  04/04/2007   SHOULDER ARTHROSCOPY WITH ROTATOR CUFF REPAIR AND SUBACROMIAL DECOMPRESSION  09/26/2012   Procedure: SHOULDER ARTHROSCOPY WITH ROTATOR CUFF REPAIR AND SUBACROMIAL DECOMPRESSION;  Surgeon: LELON JONETTA Shari Mickey., MD;  Location: [facility] OR;  Service:  Orthopedics;  Laterality: Right;  RIGHT SHOULDER ARTHROSCOPY WITH EXTENSIVE DEBRIDEMENT, SUBACROMIAL DECOMPRESSION, PARTIAL ACROMIOPLASTY WITH CORACROMIAL RELEASE   Family History  Problem Relation Age of Onset   Leukemia Sister    Heart disease Father        CABG 76   Hypertension Father    Hyperlipidemia Father    Prostate cancer Father 38   Colon cancer Maternal Aunt    Arthritis Mother    Hyperlipidemia Mother    Hypertension Mother    Arthritis Maternal Grandmother    Breast cancer Maternal Grandmother    Stroke Maternal Grandmother    Arthritis Maternal Grandfather    Arthritis Paternal Grandmother    Arthritis Paternal Grandfather    Heart disease Paternal Grandfather    Social History   Social History Narrative   Marital status/children/pets: Married   Education/employment: B.S., theatre stage manager for [employer]   Safety:      -Wears a bicycle helmet riding a bike: Yes     -smoke alarm in the home:Yes     - wears seatbelt: Yes     - Feels safe in their relationships: Yes    Allergies as of 10/10/2023   No Known Allergies      Medication List        Accurate as of October 10, 2023  8:32 AM. If you have any questions, ask your nurse or doctor.          STOP taking these medications    azithromycin  250 MG tablet Commonly known as: Zithromax  Z-Pak Stopped by: Charlies Bellini       TAKE these medications    amLODipine  2.5 MG tablet Commonly known as: NORVASC  Take 1 tablet (2.5 mg total) by mouth daily.   dorzolamide -timolol  2-0.5 % ophthalmic solution Commonly known as: COSOPT  Place 1 drop into both eyes 2 (two) times daily.   fenofibrate  145 MG tablet Commonly known as: Tricor  Take 1 tablet (145 mg total) by mouth daily.   multivitamin tablet Take 1 tablet by mouth daily.   omeprazole  20 MG capsule Commonly known as: PRILOSEC Take 1 capsule (20 mg total) by mouth daily.   Travoprost  (BAK Free) 0.004 % Soln ophthalmic solution Commonly known as:  TRAVATAN  Place 1 drop in each eye once a day at bedtime.   traZODone  50 MG tablet Commonly known as: DESYREL  Take 0.5-1.5 tablets (25-75 mg total) by mouth at bedtime as needed for sleep.       All past medical history, surgical history, allergies, family history, immunizations andmedications were updated in the EMR today and reviewed under the history and medication portions of their EMR.       ROS  14 pt review of systems performed and negative (unless mentioned in an HPI)  Objective: BP 136/88   Pulse 63   Temp 97.8 F (36.6 C)   Wt 187 lb 6.4 oz (85 kg)   SpO2 99%   BMI 24.72 kg/m  Physical Exam Vitals and nursing note reviewed.  Constitutional:      General: He is not in acute distress.    Appearance: Normal appearance. He is not ill-appearing, toxic-appearing or diaphoretic.  HENT:     Head: Normocephalic and atraumatic.  Eyes:     General: No scleral icterus.       Right eye: No discharge.        Left eye: No discharge.     Extraocular Movements: Extraocular movements intact.     Pupils: Pupils are equal, round, and reactive to light.  Cardiovascular:     Rate and Rhythm: Normal rate and regular rhythm.  Pulmonary:     Effort: Pulmonary effort is normal. No respiratory distress.     Breath sounds: Normal breath sounds. No wheezing, rhonchi or rales.  Musculoskeletal:     Right lower leg: No edema.     Left lower leg: No edema.  Skin:    General: Skin is warm.     Findings: No rash.  Neurological:     Mental Status: He is alert and oriented to person, place, and time. Mental status is at baseline.  Psychiatric:        Mood and Affect: Mood normal.        Behavior: Behavior normal.        Thought Content: Thought content normal.        Judgment: Judgment normal.     No results found.  Assessment/plan: Micheal Lawson is a 58 y.o. male present for chronic condition management Gastroesophageal reflux disease without esophagitis Stable Continue  omeprazole  as needed  B12, mag, vitamin D -next cpe if still requiring PPI QD  Essential hypertension/hypertrig Stable Continue amlodipine  2.5 mg daily Continue omega-3 supplementation OTC Continue fenofibrate  145 mg CMP and lipids collected today for recheck since starting fenofibrate .  Insomnia: Stable Continue Trazodone  25-50 mg.    Return in about 9 months (around 07/08/2024) for cpe (20 min), Routine chronic condition follow-up.   Orders Placed This Encounter  Procedures   Comp Met (CMET)   Lipid panel   No orders of the defined types were placed in this encounter.  Referral Orders  No referral(s) requested today     Note is dictated utilizing voice recognition software. Although note has been proof read prior to signing, occasional typographical errors still can be missed. If any questions arise, please do not hesitate to call for verification.  Electronically signed by: Charlies Bellini, DO [facility] [place]

## 2024-01-03 ENCOUNTER — Other Ambulatory Visit (HOSPITAL_COMMUNITY): Payer: Self-pay

## 2024-01-06 ENCOUNTER — Other Ambulatory Visit (HOSPITAL_COMMUNITY): Payer: Self-pay

## 2024-01-30 ENCOUNTER — Other Ambulatory Visit: Payer: Self-pay

## 2024-03-23 DIAGNOSIS — H524 Presbyopia: Secondary | ICD-10-CM | POA: Diagnosis not present

## 2024-03-23 DIAGNOSIS — Z135 Encounter for screening for eye and ear disorders: Secondary | ICD-10-CM | POA: Diagnosis not present

## 2024-03-23 DIAGNOSIS — H401122 Primary open-angle glaucoma, left eye, moderate stage: Secondary | ICD-10-CM | POA: Diagnosis not present

## 2024-03-23 DIAGNOSIS — H401111 Primary open-angle glaucoma, right eye, mild stage: Secondary | ICD-10-CM | POA: Diagnosis not present

## 2024-03-23 DIAGNOSIS — H5213 Myopia, bilateral: Secondary | ICD-10-CM | POA: Diagnosis not present

## 2024-03-30 ENCOUNTER — Other Ambulatory Visit (HOSPITAL_COMMUNITY): Payer: Self-pay

## 2024-05-04 ENCOUNTER — Other Ambulatory Visit: Payer: Self-pay

## 2024-05-14 ENCOUNTER — Other Ambulatory Visit (HOSPITAL_BASED_OUTPATIENT_CLINIC_OR_DEPARTMENT_OTHER): Payer: Self-pay

## 2024-05-14 ENCOUNTER — Telehealth: Payer: Self-pay

## 2024-05-14 ENCOUNTER — Ambulatory Visit
Admission: EM | Admit: 2024-05-14 | Discharge: 2024-05-14 | Disposition: A | Discharge status: Home / Self Care | Attending: Family Medicine | Admitting: Family Medicine

## 2024-05-14 ENCOUNTER — Encounter: Payer: Self-pay | Admitting: Emergency Medicine

## 2024-05-14 DIAGNOSIS — H8110 Benign paroxysmal vertigo, unspecified ear: Secondary | ICD-10-CM

## 2024-05-14 DIAGNOSIS — G44209 Tension-type headache, unspecified, not intractable: Secondary | ICD-10-CM | POA: Diagnosis not present

## 2024-05-14 MED ORDER — MECLIZINE HCL 25 MG PO TABS: 25.0000 mg | ORAL_TABLET | Freq: Three times a day (TID) | ORAL | 0 refills | Status: DC | PRN

## 2024-05-14 MED ORDER — ONDANSETRON HCL 8 MG PO TABS
8.0000 mg | ORAL_TABLET | Freq: Once | ORAL | Status: AC
  Administered 2024-05-14: 8 mg via ORAL

## 2024-05-14 MED ORDER — ONDANSETRON HCL 8 MG PO TABS
8.0000 mg | ORAL_TABLET | Freq: Three times a day (TID) | ORAL | 0 refills | Status: DC | PRN
  Filled 2024-05-14: qty 20, 7d supply, fill #0

## 2024-05-14 MED ORDER — IBUPROFEN 800 MG PO TABS
800.0000 mg | ORAL_TABLET | Freq: Three times a day (TID) | ORAL | 0 refills | Status: DC
  Filled 2024-05-14: qty 21, 7d supply, fill #0

## 2024-05-14 MED ORDER — MECLIZINE HCL 25 MG PO TABS
25.0000 mg | ORAL_TABLET | Freq: Three times a day (TID) | ORAL | 0 refills | Status: DC | PRN
  Filled 2024-05-14: qty 30, 10d supply, fill #0

## 2024-05-14 NOTE — Telephone Encounter (Signed)
 Meclizine  rx resent to pharmacy

## 2024-05-14 NOTE — Discharge Instructions (Addendum)
 Take ibuprofen  3 times a day with food.  This will help with the headache and muscle tension in your neck Take Zofran  as needed for nausea Meclizine  will help with the dizzy sensation Push fluids Go to emergency room if you fail to improve over the next couple of days Call your primary care doctor for follow-up

## 2024-05-14 NOTE — ED Triage Notes (Signed)
 Patient c/o HA x 4 days, some nausea and vomiting.  Pain has gotten better but feeling somewhat dizzy today.  No history of migraine.  Patient has taken Motrin  and Aleve for pain.

## 2024-05-14 NOTE — ED Provider Notes (Signed)
 [facility]    CSN: [id number] Arrival date & time: 05/14/24  1247      History   Chief Complaint Chief Complaint  Patient presents with   Headache    HPI Micheal Lawson is a 58 y.o. male.   Patient states he has not felt well for 4 days.  Has had a headache.  He has had a couple spells of nausea and vomiting.  He states that his pulse has been dropping down into the 50s.  He states that with changes in position he sometimes feels little lightheaded.  He vomited earlier today.  Denies fever or chills.  Denies body aches.  Does not think he has a viral infection.  Does not have any sore throat cough or runny nose.  No sinus symptoms.  No head injury.  No numbness or weakness in extremities, no change in cognition.  He does not have a history of migraines.  He does endorse increased stress.  Patient states the headache starts at the back of his head and comes up over on the top of his head.  No visual symptoms.    Past Medical History:  Diagnosis Date   ALLERGIC RHINITIS 05/08/2007   Qualifier: Diagnosis of  By: Wilhemina RMA, [name]     COVID-19 virus infection 10/2019   Frequent headaches    GLAUCOMA 04/01/2009   DR. Bryan at [facility] [place]   History of colon polyps    hyperplastic - Dr. Teressa   Hypertension     Patient Active Problem List   Diagnosis Date Noted   Skin lesion of left leg 06/05/2019   Skin lesion of scalp 06/05/2019   Skin lesion of back 06/05/2019   Chronic midline low back pain without sciatica 06/05/2019   Sleep disturbance 06/19/2018   Hypertriglyceridemia 06/19/2018   Essential hypertension 06/18/2018   GERD (gastroesophageal reflux disease) 06/08/2014   GLAUCOMA 04/01/2009    Past Surgical History:  Procedure Laterality Date   CYST REMOVAL NECK     ELECTROCARDIOGRAM  04/04/2007   SHOULDER ARTHROSCOPY WITH ROTATOR CUFF REPAIR AND SUBACROMIAL DECOMPRESSION  09/26/2012   Procedure: SHOULDER ARTHROSCOPY WITH ROTATOR CUFF REPAIR AND  SUBACROMIAL DECOMPRESSION;  Surgeon: LELON JONETTA Shari Mickey., MD;  Location: [facility] OR;  Service: Orthopedics;  Laterality: Right;  RIGHT SHOULDER ARTHROSCOPY WITH EXTENSIVE DEBRIDEMENT, SUBACROMIAL DECOMPRESSION, PARTIAL ACROMIOPLASTY WITH CORACROMIAL RELEASE       Home Medications    Prior to Admission medications   Medication Sig Start Date End Date Taking? Authorizing Provider  amLODipine  (NORVASC ) 2.5 MG tablet Take 1 tablet (2.5 mg total) by mouth daily. 07/08/23 07/04/24 Yes Kuneff, Renee A, DO  dorzolamide -timolol  (COSOPT ) 22.3-6.8 MG/ML ophthalmic solution Place 1 drop into both eyes 2 (two) times daily.   Yes [provider]  fenofibrate  (TRICOR ) 145 MG tablet Take 1 tablet (145 mg total) by mouth daily. 07/09/23  Yes Kuneff, Renee A, DO  ibuprofen  (ADVIL ) 800 MG tablet Take 1 tablet (800 mg total) by mouth 3 (three) times daily. 05/14/24  Yes Maranda Jamee Jacob, MD  Multiple Vitamin (MULTIVITAMIN) tablet Take 1 tablet by mouth daily.   Yes [provider]  omeprazole  (PRILOSEC) 20 MG capsule Take 1 capsule (20 mg total) by mouth daily. 07/08/23 07/07/24 Yes Kuneff, Renee A, DO  ondansetron  (ZOFRAN ) 8 MG tablet Take 1 tablet (8 mg total) by mouth every 8 (eight) hours as needed for nausea or vomiting. 05/14/24  Yes Maranda Jamee Jacob, MD  Travoprost , BAK Free, (  TRAVATAN ) 0.004 % SOLN ophthalmic solution Place 1 drop in each eye once a day at bedtime. 03/08/21  Yes   traZODone  (DESYREL ) 50 MG tablet Take 0.5-1.5 tablets (25-75 mg total) by mouth at bedtime as needed for sleep. 07/08/23  Yes Kuneff, Renee A, DO    Family History Family History  Problem Relation Age of Onset   Leukemia Sister    Heart disease Father        CABG 85   Hypertension Father    Hyperlipidemia Father    Prostate cancer Father 89   Colon cancer Maternal Aunt    Arthritis Mother    Hyperlipidemia Mother    Hypertension Mother    Arthritis Maternal Grandmother    Breast cancer Maternal Grandmother     Stroke Maternal Grandmother    Arthritis Maternal Grandfather    Arthritis Paternal Grandmother    Arthritis Paternal Grandfather    Heart disease Paternal Grandfather     Social History Social History   Tobacco Use   Smoking status: Never   Smokeless tobacco: Never  Vaping Use   Vaping status: Never Used  Substance Use Topics   Alcohol use: Yes    Alcohol/week: 2.0 standard drinks of alcohol    Types: 2 Cans of beer per week    Comment: occ   Drug use: No     Allergies   Patient has no known allergies.   Review of Systems Review of Systems See HPI  Physical Exam Triage Vital Signs ED Triage Vitals  Encounter Vitals Group     BP 05/14/24 1256 (!) 137/92     Girls Systolic BP Percentile --      Girls Diastolic BP Percentile --      Boys Systolic BP Percentile --      Boys Diastolic BP Percentile --      Pulse Rate 05/14/24 1256 (!) 57     Resp 05/14/24 1256 18     Temp 05/14/24 1256 98 F (36.7 C)     Temp Source 05/14/24 1256 Oral     SpO2 05/14/24 1256 98 %     Weight 05/14/24 1258 190 lb (86.2 kg)     Height 05/14/24 1258 6' 1 (1.854 m)     Head Circumference --      Peak Flow --      Pain Score 05/14/24 1258 2     Pain Loc --      Pain Education --      Exclude from Growth Chart --    Orthostatic VS for the past 24 hrs:  BP- Lying Pulse- Lying BP- Sitting Pulse- Sitting BP- Standing at 0 minutes Pulse- Standing at 0 minutes  05/14/24 1320 132/89 61 131/86 62 130/90 66    Updated Vital Signs BP (!) 137/92 (BP Location: Right Arm)   Pulse (!) 57   Temp 98 F (36.7 C) (Oral)   Resp 18   Ht 6' 1 (1.854 m)   Wt 86.2 kg   SpO2 98%   BMI 25.07 kg/m      Physical Exam Constitutional:      Appearance: He is well-developed and normal weight. He is not ill-appearing.  HENT:     Head: Normocephalic and atraumatic.  Eyes:     General: No visual field deficit.    Extraocular Movements: Extraocular movements intact.     Right eye: No nystagmus.      Left eye: No nystagmus.     Pupils: Pupils are  equal, round, and reactive to light.     Comments: Discs are flat.  No nystagmus.  Pupils equal and reactive.  Neck:     Comments: No tenderness in posterior paraspinous cervical muscles or upper body of trapezius.  Neck is full but slow range of motion Cardiovascular:     Rate and Rhythm: Normal rate and regular rhythm.     Heart sounds: Normal heart sounds.  Pulmonary:     Effort: Pulmonary effort is normal.     Breath sounds: Normal breath sounds.  Abdominal:     General: Bowel sounds are normal.     Palpations: Abdomen is soft.     Tenderness: There is no abdominal tenderness.     Comments: No organomegaly  Musculoskeletal:     Cervical back: Normal range of motion.  Lymphadenopathy:     Cervical: No cervical adenopathy.  Skin:    Findings: No rash.  Neurological:     Mental Status: He is alert.     Cranial Nerves: No cranial nerve deficit, dysarthria or facial asymmetry.     Coordination: Coordination normal.     Gait: Gait normal.     Deep Tendon Reflexes: Reflexes normal.  Psychiatric:        Mood and Affect: Mood normal.        Behavior: Behavior normal.      UC Treatments / Results  Labs (all labs ordered are listed, but only abnormal results are displayed) Labs Reviewed - No data to display  EKG   Radiology No results found.  Procedures Procedures (including critical care time)  Medications Ordered in UC Medications  ondansetron  (ZOFRAN ) tablet 8 mg (8 mg Oral Given 05/14/24 1346)    Initial Impression / Assessment and Plan / UC Course  I have reviewed the triage vital signs and the nursing notes.  Pertinent labs & imaging results that were available during my care of the patient were reviewed by me and considered in my medical decision making (see chart for details).     Concern for the new onset of headaches in a patient who does not usually have them especially with vomiting.  He does have a  sensation of lightheadedness and sounds like it could be vertigo.  I explained to him he could just be coming down with something and it is a viral syndrome.  I also explained that this stress could be causing tension headaches and he also has vertigo.  Also explained that vomiting and headaches can be an intracranial process and although his examination is reassuring, if he fails to improve he needs to go to the emergency room for scanning.  Patient voices understanding.  He will call his PCP for follow-up Final Clinical Impressions(s) / UC Diagnoses   Final diagnoses:  Benign paroxysmal positional vertigo, unspecified laterality  Muscle tension headache     Discharge Instructions      Take ibuprofen  3 times a day with food.  This will help with the headache and muscle tension in your neck Take Zofran  as needed for nausea Meclizine  will help with the dizzy sensation Push fluids Go to emergency room if you fail to improve over the next couple of days Call your primary care doctor for follow-up    ED Prescriptions     Medication Sig Dispense Auth. Provider   ondansetron  (ZOFRAN ) 8 MG tablet Take 1 tablet (8 mg total) by mouth every 8 (eight) hours as needed for nausea or vomiting. 20 tablet Maranda Jamee Jacob,  MD   ibuprofen  (ADVIL ) 800 MG tablet Take 1 tablet (800 mg total) by mouth 3 (three) times daily. 21 tablet Maranda Jamee Jacob, MD      PDMP not reviewed this encounter.   Maranda Jamee Jacob, MD 05/14/24 [phone]

## 2024-05-15 ENCOUNTER — Other Ambulatory Visit (HOSPITAL_BASED_OUTPATIENT_CLINIC_OR_DEPARTMENT_OTHER): Payer: Self-pay

## 2024-05-22 ENCOUNTER — Encounter: Payer: Self-pay | Admitting: Family Medicine

## 2024-05-22 ENCOUNTER — Ambulatory Visit: Admitting: Family Medicine

## 2024-05-22 ENCOUNTER — Other Ambulatory Visit (HOSPITAL_BASED_OUTPATIENT_CLINIC_OR_DEPARTMENT_OTHER): Payer: Self-pay

## 2024-05-22 VITALS — BP 130/82 | HR 68 | Temp 97.9°F | Wt 189.0 lb

## 2024-05-22 DIAGNOSIS — M5481 Occipital neuralgia: Secondary | ICD-10-CM | POA: Insufficient documentation

## 2024-05-22 DIAGNOSIS — G44209 Tension-type headache, unspecified, not intractable: Secondary | ICD-10-CM | POA: Diagnosis not present

## 2024-05-22 MED ORDER — METHYLPREDNISOLONE ACETATE 80 MG/ML IJ SUSP
80.0000 mg | Freq: Once | INTRAMUSCULAR | Status: AC
Start: 2024-05-22 — End: 2024-05-22
  Administered 2024-05-22: 80 mg via INTRAMUSCULAR

## 2024-05-22 MED ORDER — TIZANIDINE HCL 4 MG PO TABS
2.0000 mg | ORAL_TABLET | Freq: Two times a day (BID) | ORAL | 0 refills | Status: DC | PRN
Start: 2024-05-22 — End: 2024-08-14
  Filled 2024-05-22: qty 30, 15d supply, fill #0

## 2024-05-22 NOTE — Progress Notes (Signed)
 Micheal Lawson , 04-12-1966, 58 y.o., male MRN: [id number] Patient Care Team    Relationship Specialty Notifications Start End  Catherine Charlies LABOR, DO PCP - General Family Medicine  06/16/18   Teressa Toribio SQUIBB, MD (Inactive) Attending Physician Gastroenterology  06/18/18   Dorise Lynwood Marsa DOUGLAS, MD Referring Physician Ophthalmology  06/18/18   Ivin Kocher, MD Referring Physician Dermatology  06/05/19     Chief Complaint  Patient presents with   Neck Pain    Over a week; L side. Neck pain is causing headaches. Pt has taken Ibuprofen.      Subjective: Micheal Lawson is a 58 y.o. Pt presents for an OV with complaints of left sided neck ain with headache of 5 days duration.  Pt reports on Monday he woke with headache and by afternoon he felt nauseated and vomited x1. Tuesday he felt some improvement with ibf 800 TID> He state by Wednesday the lightheadedness , stomach upset had resolved and headache improved.  Today he reports he is still having pain at the posterior portion of head/neck, but majority of headache has resolved.  He has spent the night in a hotel over the weekend. He denies neck injury Pt has tried ibf, ice, heat to ease their symptoms.      07/08/2023    8:35 AM 09/27/2021   10:15 AM 06/23/2021    1:58 PM 02/10/2021    1:00 PM 06/10/2020    8:05 AM  Depression screen PHQ 2/9  Decreased Interest 0 0 0 0 0  Down, Depressed, Hopeless 0 0 0 0 0  PHQ - 2 Score 0 0 0 0 0  Altered sleeping 3      Tired, decreased energy 0      Change in appetite 0      Feeling bad or failure about yourself  0      Trouble concentrating 0      Moving slowly or fidgety/restless 0      Suicidal thoughts 0      PHQ-9 Score 3      Difficult doing work/chores Somewhat difficult        No Known Allergies Social History   Social History Narrative   Marital status/children/pets: Married   Education/employment: B.S., Theatre stage manager for [employer]   Safety:      -Wears a bicycle helmet  riding a bike: Yes     -smoke alarm in the home:Yes     - wears seatbelt: Yes     - Feels safe in their relationships: Yes   Past Medical History:  Diagnosis Date   ALLERGIC RHINITIS 05/08/2007   Qualifier: Diagnosis of  By: Wilhemina RMA, [name]     COVID-19 virus infection 10/2019   Frequent headaches    GLAUCOMA 04/01/2009   DR. Bryan at [facility]   History of colon polyps    hyperplastic - Dr. Teressa   Hypertension    Past Surgical History:  Procedure Laterality Date   CYST REMOVAL NECK     ELECTROCARDIOGRAM  04/04/2007   SHOULDER ARTHROSCOPY WITH ROTATOR CUFF REPAIR AND SUBACROMIAL DECOMPRESSION  09/26/2012   Procedure: SHOULDER ARTHROSCOPY WITH ROTATOR CUFF REPAIR AND SUBACROMIAL DECOMPRESSION;  Surgeon: LELON JONETTA Shari Mickey., MD;  Location: [facility] OR;  Service: Orthopedics;  Laterality: Right;  RIGHT SHOULDER ARTHROSCOPY WITH EXTENSIVE DEBRIDEMENT, SUBACROMIAL DECOMPRESSION, PARTIAL ACROMIOPLASTY WITH CORACROMIAL RELEASE   Family History  Problem Relation Age of Onset   Leukemia Sister  Heart disease Father        CABG 100   Hypertension Father    Hyperlipidemia Father    Prostate cancer Father 13   Colon cancer Maternal Aunt    Arthritis Mother    Hyperlipidemia Mother    Hypertension Mother    Arthritis Maternal Grandmother    Breast cancer Maternal Grandmother    Stroke Maternal Grandmother    Arthritis Maternal Grandfather    Arthritis Paternal Grandmother    Arthritis Paternal Grandfather    Heart disease Paternal Grandfather    Allergies as of 05/22/2024   No Known Allergies      Medication List        Accurate as of May 22, 2024 11:18 AM. If you have any questions, ask your nurse or doctor.          STOP taking these medications    ibuprofen 800 MG tablet Commonly known as: ADVIL Stopped by: Charlies Bellini       TAKE these medications    amLODipine 2.5 MG tablet Commonly known as: NORVASC Take 1 tablet (2.5 mg total) by mouth daily.    dorzolamide-timolol 2-0.5 % ophthalmic solution Commonly known as: COSOPT Place 1 drop into both eyes 2 (two) times daily.   fenofibrate 145 MG tablet Commonly known as: Tricor Take 1 tablet (145 mg total) by mouth daily.   meclizine 25 MG tablet Commonly known as: ANTIVERT Take 1 tablet (25 mg total) by mouth 3 (three) times daily as needed for dizziness.   multivitamin tablet Take 1 tablet by mouth daily.   omeprazole 20 MG capsule Commonly known as: PRILOSEC Take 1 capsule (20 mg total) by mouth daily.   ondansetron 8 MG tablet Commonly known as: ZOFRAN Take 1 tablet (8 mg total) by mouth every 8 (eight) hours as needed for nausea or vomiting.   tiZANidine 4 MG tablet Commonly known as: Zanaflex Take 0.5-1 tablets (2-4 mg total) by mouth 2 (two) times daily as needed for muscle spasms. Started by: Amand Lemoine   Travoprost (BAK Free) 0.004 % Soln ophthalmic solution Commonly known as: TRAVATAN Place 1 drop in each eye once a day at bedtime.   traZODone 50 MG tablet Commonly known as: DESYREL Take 0.5-1.5 tablets (25-75 mg total) by mouth at bedtime as needed for sleep.        All past medical history, surgical history, allergies, family history, immunizations andmedications were updated in the EMR today and reviewed under the history and medication portions of their EMR.     ROS Negative, with the exception of above mentioned in HPI   Objective:  BP 130/82   Pulse 68   Temp 97.9 F (36.6 C)   Wt 189 lb (85.7 kg)   SpO2 98%   BMI 24.94 kg/m  Body mass index is 24.94 kg/m. Physical Exam Vitals and nursing note reviewed. Exam conducted with a chaperone present.  Constitutional:      General: He is not in acute distress.    Appearance: Normal appearance. He is not ill-appearing, toxic-appearing or diaphoretic.  HENT:     Head: Normocephalic and atraumatic.  Eyes:     General: No scleral icterus.       Right eye: No discharge.        Left eye: No  discharge.     Extraocular Movements: Extraocular movements intact.     Pupils: Pupils are equal, round, and reactive to light.  Neck:   Musculoskeletal:  General: Tenderness present. No swelling. Normal range of motion.     Cervical back: No erythema, rigidity, torticollis or crepitus. Pain with movement and muscular tenderness present. No spinous process tenderness. Normal range of motion.  Skin:    General: Skin is warm and dry.     Coloration: Skin is not jaundiced or pale.     Findings: No rash.  Neurological:     Mental Status: He is alert and oriented to person, place, and time. Mental status is at baseline.  Psychiatric:        Mood and Affect: Mood normal.        Behavior: Behavior normal.        Thought Content: Thought content normal.        Judgment: Judgment normal.     No results found. No results found. No results found for this or any previous visit (from the past 24 hours).  Assessment/Plan: CLARION MOONEYHAN is a 58 y.o. male present for OV for  Occipital neuralgia of left side (Primary) with Tension headache - IM depo medrol 80 inj today - zanaflex BID prn- rec 1 week at bedtime scheduled.  - f/u prn Reviewed expectations re: course of current medical issues. Discussed self-management of symptoms. Outlined signs and symptoms indicating need for more acute intervention. Patient verbalized understanding and all questions were answered. Patient received an After-Visit Summary.    No orders of the defined types were placed in this encounter.  Meds ordered this encounter  Medications   tiZANidine (ZANAFLEX) 4 MG tablet    Sig: Take 0.5-1 tablets (2-4 mg total) by mouth 2 (two) times daily as needed for muscle spasms.    Dispense:  30 tablet    Refill:  0   methylPREDNISolone acetate (DEPO-MEDROL) injection 80 mg   Referral Orders  No referral(s) requested today     Note is dictated utilizing voice recognition software. Although note has been  proof read prior to signing, occasional typographical errors still can be missed. If any questions arise, please do not hesitate to call for verification.   electronically signed by:  Charlies Bellini, DO  [facility] - OR

## 2024-05-22 NOTE — Patient Instructions (Addendum)

## 2024-06-21 ENCOUNTER — Other Ambulatory Visit: Payer: Self-pay | Admitting: Family Medicine

## 2024-06-21 DIAGNOSIS — K219 Gastro-esophageal reflux disease without esophagitis: Secondary | ICD-10-CM

## 2024-06-22 ENCOUNTER — Other Ambulatory Visit (HOSPITAL_COMMUNITY): Payer: Self-pay

## 2024-06-22 ENCOUNTER — Other Ambulatory Visit: Payer: Self-pay

## 2024-06-22 MED ORDER — OMEPRAZOLE 20 MG PO CPDR
20.0000 mg | DELAYED_RELEASE_CAPSULE | Freq: Every day | ORAL | 0 refills | Status: DC
  Filled 2024-06-22: qty 30, 30d supply, fill #0

## 2024-06-22 MED ORDER — FENOFIBRATE 145 MG PO TABS
145.0000 mg | ORAL_TABLET | Freq: Every day | ORAL | 0 refills | Status: DC
  Filled 2024-06-22 – 2024-07-27 (×2): qty 30, 30d supply, fill #0

## 2024-07-08 ENCOUNTER — Encounter: Payer: 59 | Admitting: Family Medicine

## 2024-07-08 DIAGNOSIS — K219 Gastro-esophageal reflux disease without esophagitis: Secondary | ICD-10-CM

## 2024-07-09 ENCOUNTER — Encounter: Payer: 59 | Admitting: Family Medicine

## 2024-07-27 ENCOUNTER — Other Ambulatory Visit (HOSPITAL_COMMUNITY): Payer: Self-pay

## 2024-07-27 ENCOUNTER — Other Ambulatory Visit: Payer: Self-pay

## 2024-07-27 ENCOUNTER — Other Ambulatory Visit: Payer: Self-pay | Admitting: Family Medicine

## 2024-07-27 DIAGNOSIS — K219 Gastro-esophageal reflux disease without esophagitis: Secondary | ICD-10-CM

## 2024-07-28 ENCOUNTER — Other Ambulatory Visit (HOSPITAL_COMMUNITY): Payer: Self-pay

## 2024-07-28 ENCOUNTER — Other Ambulatory Visit: Payer: Self-pay

## 2024-07-28 MED ORDER — OMEPRAZOLE 20 MG PO CPDR
20.0000 mg | DELAYED_RELEASE_CAPSULE | Freq: Every day | ORAL | 0 refills | Status: DC
  Filled 2024-07-28: qty 30, 30d supply, fill #0

## 2024-08-14 ENCOUNTER — Other Ambulatory Visit (HOSPITAL_COMMUNITY): Payer: Self-pay

## 2024-08-14 ENCOUNTER — Ambulatory Visit: Admitting: Family Medicine

## 2024-08-14 ENCOUNTER — Other Ambulatory Visit (HOSPITAL_BASED_OUTPATIENT_CLINIC_OR_DEPARTMENT_OTHER): Payer: Self-pay

## 2024-08-14 ENCOUNTER — Encounter: Payer: Self-pay | Admitting: Family Medicine

## 2024-08-14 VITALS — BP 130/80 | HR 54 | Temp 98.1°F | Wt 185.0 lb

## 2024-08-14 DIAGNOSIS — E781 Pure hyperglyceridemia: Secondary | ICD-10-CM

## 2024-08-14 DIAGNOSIS — I10 Essential (primary) hypertension: Secondary | ICD-10-CM

## 2024-08-14 DIAGNOSIS — Z23 Encounter for immunization: Secondary | ICD-10-CM

## 2024-08-14 DIAGNOSIS — K219 Gastro-esophageal reflux disease without esophagitis: Secondary | ICD-10-CM

## 2024-08-14 DIAGNOSIS — Z125 Encounter for screening for malignant neoplasm of prostate: Secondary | ICD-10-CM | POA: Diagnosis not present

## 2024-08-14 DIAGNOSIS — Z79899 Other long term (current) drug therapy: Secondary | ICD-10-CM

## 2024-08-14 DIAGNOSIS — Z131 Encounter for screening for diabetes mellitus: Secondary | ICD-10-CM | POA: Diagnosis not present

## 2024-08-14 DIAGNOSIS — G479 Sleep disorder, unspecified: Secondary | ICD-10-CM

## 2024-08-14 DIAGNOSIS — Z0000 Encounter for general adult medical examination without abnormal findings: Secondary | ICD-10-CM | POA: Diagnosis not present

## 2024-08-14 LAB — CBC
HCT: 43.2 % (ref 39.0–52.0)
Hemoglobin: 14.9 g/dL (ref 13.0–17.0)
MCHC: 34.4 g/dL (ref 30.0–36.0)
MCV: 90.2 fl (ref 78.0–100.0)
Platelets: 343 K/uL (ref 150.0–400.0)
RBC: 4.79 Mil/uL (ref 4.22–5.81)
RDW: 13 % (ref 11.5–15.5)
WBC: 5.8 K/uL (ref 4.0–10.5)

## 2024-08-14 LAB — LIPID PANEL
Cholesterol: 180 mg/dL (ref 0–200)
HDL: 60.2 mg/dL (ref >39.00)
LDL Cholesterol: 97 mg/dL (ref 0–99)
NonHDL: 119.87
Total CHOL/HDL Ratio: 3
Triglycerides: 115 mg/dL (ref 0.0–149.0)
VLDL: 23 mg/dL (ref 0.0–40.0)

## 2024-08-14 LAB — COMPREHENSIVE METABOLIC PANEL WITH GFR
ALT: 32 U/L (ref 0–53)
AST: 26 U/L (ref 0–37)
Albumin: 4.8 g/dL (ref 3.5–5.2)
Alkaline Phosphatase: 54 U/L (ref 39–117)
BUN: 15 mg/dL (ref 6–23)
CO2: 28 meq/L (ref 19–32)
Calcium: 9.8 mg/dL (ref 8.4–10.5)
Chloride: 105 meq/L (ref 96–112)
Creatinine, Ser: 1.16 mg/dL (ref 0.40–1.50)
GFR: 69.44 mL/min (ref >60.00)
Glucose, Bld: 86 mg/dL (ref 70–99)
Potassium: 4.3 meq/L (ref 3.5–5.1)
Sodium: 141 meq/L (ref 135–145)
Total Bilirubin: 0.8 mg/dL (ref 0.2–1.2)
Total Protein: 7.1 g/dL (ref 6.0–8.3)

## 2024-08-14 LAB — MAGNESIUM: Magnesium: 2.2 mg/dL (ref 1.5–2.5)

## 2024-08-14 LAB — VITAMIN D 25 HYDROXY (VIT D DEFICIENCY, FRACTURES): VITD: 19.48 ng/mL — ABNORMAL LOW (ref 30.00–100.00)

## 2024-08-14 LAB — HEMOGLOBIN A1C: Hgb A1c MFr Bld: 5.2 % (ref 4.6–6.5)

## 2024-08-14 LAB — VITAMIN B12: Vitamin B-12: 184 pg/mL — ABNORMAL LOW (ref 211–911)

## 2024-08-14 LAB — PSA: PSA: 1.16 ng/mL (ref 0.10–4.00)

## 2024-08-14 LAB — TSH: TSH: 2.2 u[IU]/mL (ref 0.35–5.50)

## 2024-08-14 MED ORDER — OMEPRAZOLE 20 MG PO CPDR
20.0000 mg | DELAYED_RELEASE_CAPSULE | Freq: Every day | ORAL | 3 refills | Status: AC
  Filled 2024-08-14 – 2024-09-16 (×2): qty 90, 90d supply, fill #0

## 2024-08-14 MED ORDER — AMLODIPINE BESYLATE 2.5 MG PO TABS
2.5000 mg | ORAL_TABLET | Freq: Every day | ORAL | 1 refills | Status: AC
  Filled 2024-08-14 – 2024-09-16 (×2): qty 90, 90d supply, fill #0

## 2024-08-14 MED ORDER — FENOFIBRATE 145 MG PO TABS
145.0000 mg | ORAL_TABLET | Freq: Every day | ORAL | 3 refills | Status: AC
  Filled 2024-08-14 – 2024-09-16 (×2): qty 90, 90d supply, fill #0

## 2024-08-14 NOTE — Patient Instructions (Addendum)

## 2024-08-14 NOTE — Progress Notes (Signed)
 Patient ID: Micheal Lawson, male  DOB: June 28, 1966, 58 y.o.   MRN: [id number] Patient Care Team    Relationship Specialty Notifications Start End  Catherine Charlies LABOR, DO PCP - General Family Medicine  06/16/18   Teressa Toribio SQUIBB, MD (Inactive) Attending Physician Gastroenterology  06/18/18   Dorise Lynwood Marsa DOUGLAS, MD Referring Physician Ophthalmology  06/18/18   Ivin Kocher, MD Referring Physician Dermatology  06/05/19     Chief Complaint  Patient presents with   Annual Exam    Chronic condition management Pt is fasting.  Influenza vaccine if not received at work Prevnar vaccine    Subjective:  Micheal Lawson is a 58 y.o. male present for CPE and chronic condition management appointment All past medical history, surgical history, allergies, family history, immunizations, medications and social history were updated in the electronic medical record today. All recent labs, ED visits and hospitalizations within the last year were reviewed.  Health maintenance:  Colonoscopy: completed 06/2016, by Dr. Teressa- hyperplastic polyp x1. follow up 10 years (2027). Immunizations: tdap UTD 2018, Influenza UTD 07/2024 (encouraged yearly), shingrix  series completed.Covid series completed, Prevnar given today Infectious disease screening: HIV completed 2017, hep c completed PSA: fhx in father (79) PSA yearly recommended.collected for cpe  Lab Results  Component Value Date   PSA 1.36 07/08/2023   PSA 0.94 06/29/2022   PSA 1.20 06/23/2021   Patient has a Dental home. Hospitalizations/ED visits: Reviewed  Gastroesophageal reflux disease without esophagitis Using PPI daily now-symptoms controlled.  Unable to wean off without recurrence of symptoms.   Essential hypertension/hypertrig Pt reports compliance with amlodipine  2.5mg  and fenofibrate  for elevated triglycerides.  Patient denies chest pain, shortness of breath, dizziness or lower extremity edema.   He did start fish oil 1000  mg.  Exercise: routinely exercises. RF: HTN, FHX, hypertrig     08/14/2024    9:59 AM 07/08/2023    8:35 AM 09/27/2021   10:15 AM 06/23/2021    1:58 PM 02/10/2021    1:00 PM  Depression screen PHQ 2/9  Decreased Interest 0 0 0 0 0  Down, Depressed, Hopeless 1 0 0 0 0  PHQ - 2 Score 1 0 0 0 0  Altered sleeping 1 3     Tired, decreased energy 0 0     Change in appetite 0 0     Feeling bad or failure about yourself  0 0     Trouble concentrating 0 0     Moving slowly or fidgety/restless 0 0     Suicidal thoughts 0 0     PHQ-9 Score 2 3      Difficult doing work/chores Not difficult at all Somewhat difficult        Data saved with a previous flowsheet row definition      08/14/2024    9:59 AM 07/08/2023    8:35 AM  GAD 7 : Generalized Anxiety Score  Nervous, Anxious, on Edge 1 1  Control/stop worrying 0 1  Worry too much - different things 1 1  Trouble relaxing 1 1  Restless 1 1  Easily annoyed or irritable 1 1  Afraid - awful might happen 0 0  Total GAD 7 Score 5 6  Anxiety Difficulty Not difficult at all Somewhat difficult            08/14/2024    9:59 AM 10/10/2023    8:20 AM 07/08/2023    8:30 AM 06/05/2019    8:45 AM  06/18/2018    8:53 AM  Fall Risk   Falls in the past year? 0 0 1 0  No   Number falls in past yr: 0 0 0 0    Injury with Fall? 0 0 0 0   Risk for fall due to :  No Fall Risks History of fall(s)    Follow up Falls evaluation completed Falls evaluation completed Falls evaluation completed Falls evaluation completed       Data saved with a previous flowsheet row definition      Immunization History  Administered Date(s) Administered   Influenza Whole 12/27/2008   Influenza, Seasonal, Injecte, Preservative Fre 07/08/2023   Influenza,inj,Quad PF,6+ Mos 06/10/2020, 06/23/2021, 07/02/2022   Influenza-Unspecified 07/03/2017, 07/14/2024   Moderna Sars-Covid-2 Vaccination 12/16/2020   PFIZER Comirnaty(Gray Top)Covid-19 Tri-Sucrose Vaccine 12/16/2020    PFIZER(Purple Top)SARS-COV-2 Vaccination 10/23/2019, 11/13/2019   Td 05/08/2001, 12/27/2008   Tdap 03/30/2017   Zoster Recombinant(Shingrix ) 06/18/2018, 09/17/2018     Past Medical History:  Diagnosis Date   ALLERGIC RHINITIS 05/08/2007   Qualifier: Diagnosis of  By: Wilhemina RMA, [name]     COVID-19 virus infection 10/2019   Frequent headaches    GLAUCOMA 04/01/2009   DR. Bryan at [facility] [place]   History of colon polyps    hyperplastic - Dr. Teressa   Hypertension    No Known Allergies Past Surgical History:  Procedure Laterality Date   CYST REMOVAL NECK     ELECTROCARDIOGRAM  04/04/2007   SHOULDER ARTHROSCOPY WITH ROTATOR CUFF REPAIR AND SUBACROMIAL DECOMPRESSION  09/26/2012   Procedure: SHOULDER ARTHROSCOPY WITH ROTATOR CUFF REPAIR AND SUBACROMIAL DECOMPRESSION;  Surgeon: LELON JONETTA Shari Mickey., MD;  Location: [facility] OR;  Service: Orthopedics;  Laterality: Right;  RIGHT SHOULDER ARTHROSCOPY WITH EXTENSIVE DEBRIDEMENT, SUBACROMIAL DECOMPRESSION, PARTIAL ACROMIOPLASTY WITH CORACROMIAL RELEASE   Family History  Problem Relation Age of Onset   Arthritis Mother    Hyperlipidemia Mother    Hypertension Mother    Heart disease Father        CABG 53   Hypertension Father    Hyperlipidemia Father    Prostate cancer Father 15   Leukemia Sister    Arthritis Maternal Grandmother    Breast cancer Maternal Grandmother    Stroke Maternal Grandmother    Arthritis Maternal Grandfather    Arthritis Paternal Grandmother    Arthritis Paternal Grandfather    Heart disease Paternal Grandfather    Colon cancer Maternal Aunt    Social History   Social History Narrative   Marital status/children/pets: Married   Education/employment: B.S., sys analyst for [employer]   Safety:      -Wears a bicycle helmet riding a bike: Yes     -smoke alarm in the home:Yes     - wears seatbelt: Yes     - Feels safe in their relationships: Yes    Allergies as of 08/14/2024   No Known Allergies      Medication List         Accurate as of August 14, 2024 10:17 AM. If you have any questions, ask your nurse or doctor.          STOP taking these medications    meclizine  25 MG tablet Commonly known as: ANTIVERT  Stopped by: Charlies Bellini   ondansetron  8 MG tablet Commonly known as: ZOFRAN  Stopped by: Charlies Bellini   tiZANidine  4 MG tablet Commonly known as: Zanaflex  Stopped by: Charlies Bellini       TAKE these medications  amLODipine  2.5 MG tablet Commonly known as: NORVASC  Take 1 tablet (2.5 mg total) by mouth daily.   dorzolamide -timolol  2-0.5 % ophthalmic solution Commonly known as: COSOPT  Place 1 drop into both eyes 2 (two) times daily.   fenofibrate  145 MG tablet Commonly known as: Tricor  Take 1 tablet (145 mg total) by mouth daily.   multivitamin tablet Take 1 tablet by mouth daily.   omeprazole  20 MG capsule Commonly known as: PRILOSEC Take 1 capsule (20 mg total) by mouth daily.   Travoprost  (BAK Free) 0.004 % Soln ophthalmic solution Commonly known as: TRAVATAN  Place 1 drop in each eye once a day at bedtime.   traZODone  50 MG tablet Commonly known as: DESYREL  Take 0.5-1.5 tablets (25-75 mg total) by mouth at bedtime as needed for sleep.       All past medical history, surgical history, allergies, family history, immunizations andmedications were updated in the EMR today and reviewed under the history and medication portions of their EMR.       Review of Systems  All other systems reviewed and are negative.  14 pt review of systems performed and negative (unless mentioned in an HPI)  Objective: BP 130/80   Pulse (!) 54   Temp 98.1 F (36.7 C)   Wt 185 lb (83.9 kg)   SpO2 98%   BMI 24.41 kg/m  Physical Exam Vitals and nursing note reviewed. Exam conducted with a chaperone present.  Constitutional:      General: He is not in acute distress.    Appearance: Normal appearance. He is not ill-appearing, toxic-appearing or diaphoretic.  HENT:     Head:  Normocephalic and atraumatic.     Right Ear: Tympanic membrane, ear canal and external ear normal. There is no impacted cerumen.     Left Ear: Tympanic membrane, ear canal and external ear normal. There is no impacted cerumen.     Nose: Nose normal. No congestion or rhinorrhea.     Mouth/Throat:     Mouth: Mucous membranes are moist.     Pharynx: Oropharynx is clear. No oropharyngeal exudate or posterior oropharyngeal erythema.  Eyes:     General: No scleral icterus.       Right eye: No discharge.        Left eye: No discharge.     Extraocular Movements: Extraocular movements intact.     Pupils: Pupils are equal, round, and reactive to light.  Cardiovascular:     Rate and Rhythm: Normal rate and regular rhythm.     Pulses: Normal pulses.     Heart sounds: Normal heart sounds. No murmur heard.    No friction rub. No gallop.  Pulmonary:     Effort: Pulmonary effort is normal. No respiratory distress.     Breath sounds: Normal breath sounds. No stridor. No wheezing, rhonchi or rales.  Chest:     Chest wall: No tenderness.  Abdominal:     General: Abdomen is flat. Bowel sounds are normal. There is no distension.     Palpations: Abdomen is soft. There is no mass.     Tenderness: There is no abdominal tenderness. There is no right CVA tenderness, left CVA tenderness, guarding or rebound.     Hernia: No hernia is present.  Musculoskeletal:        General: No swelling or tenderness. Normal range of motion.     Cervical back: Normal range of motion and neck supple.     Right lower leg: No edema.  Left lower leg: No edema.  Lymphadenopathy:     Cervical: No cervical adenopathy.  Skin:    General: Skin is warm and dry.     Coloration: Skin is not jaundiced.     Findings: No bruising, lesion or rash.  Neurological:     General: No focal deficit present.     Mental Status: He is alert and oriented to person, place, and time. Mental status is at baseline.     Cranial Nerves: No cranial  nerve deficit.     Sensory: No sensory deficit.     Motor: No weakness.     Coordination: Coordination normal.     Gait: Gait normal.     Deep Tendon Reflexes: Reflexes normal.  Psychiatric:        Mood and Affect: Mood normal.        Behavior: Behavior normal.        Thought Content: Thought content normal.        Judgment: Judgment normal.     No results found.  Assessment/plan: Micheal Lawson is a 58 y.o. male present for CPE and chronic condition management Gastroesophageal reflux disease without esophagitis/long-term proton pump inhibitor use Stable Continue omeprazole  as needed  B12, mag, vitamin D-collected today  Essential hypertension/hypertrig Stable Continue amlodipine  2.5 mg daily Continue omega-3 supplementation OTC Continue fenofibrate  145 mg Labs collected today  Insomnia: Stable Continue trazodone  25-50 mg.   Prostate cancer screening - PSA Long-term current use of proton pump inhibitor therapy - B12 - Magnesium - Vitamin D (25 hydroxy) Diabetes mellitus screening - Hemoglobin A1c Need for vaccination for pneumococcus - Pneumococcal conjugate vaccine 20-valent  Routine general medical examination at a health care facility (Primary) Patient was encouraged to exercise greater than 150 minutes a week. Patient was encouraged to choose a diet filled with fresh fruits and vegetables, and lean meats. AVS provided to patient today for education/recommendation on gender specific health and safety maintenance. Colonoscopy: completed 06/2016, by Dr. Teressa- hyperplastic polyp x1. follow up 10 years (2027). Immunizations: tdap UTD 2018, Influenza UTD 07/2024 (encouraged yearly), shingrix  series completed.Covid series completed, Prevnar given today Infectious disease screening: HIV completed 2017, hep c completed PSA: fhx in father (79) PSA yearly recommended.collected for cpe   Return in about 25 weeks (around 02/05/2025).   Orders Placed This Encounter   Procedures   Pneumococcal conjugate vaccine 20-valent   CBC   Hemoglobin A1c   Comprehensive metabolic panel with GFR   Lipid panel   PSA   TSH   B12   Magnesium   Vitamin D (25 hydroxy)   Meds ordered this encounter  Medications   amLODipine  (NORVASC ) 2.5 MG tablet    Sig: Take 1 tablet (2.5 mg total) by mouth daily.    Dispense:  90 tablet    Refill:  1   fenofibrate  (TRICOR ) 145 MG tablet    Sig: Take 1 tablet (145 mg total) by mouth daily.    Dispense:  90 tablet    Refill:  3   omeprazole  (PRILOSEC) 20 MG capsule    Sig: Take 1 capsule (20 mg total) by mouth daily.    Dispense:  90 capsule    Refill:  3   Referral Orders  No referral(s) requested today     Note is dictated utilizing voice recognition software. Although note has been proof read prior to signing, occasional typographical errors still can be missed. If any questions arise, please do not hesitate to call for verification.  Electronically signed by: Charlies Bellini, DO [facility] [place]

## 2024-08-17 ENCOUNTER — Other Ambulatory Visit (HOSPITAL_COMMUNITY): Payer: Self-pay

## 2024-08-17 ENCOUNTER — Ambulatory Visit: Payer: Self-pay | Admitting: Family Medicine

## 2024-08-17 ENCOUNTER — Other Ambulatory Visit (HOSPITAL_BASED_OUTPATIENT_CLINIC_OR_DEPARTMENT_OTHER): Payer: Self-pay

## 2024-08-17 MED ORDER — B-12 1000 MCG SL SUBL
2000.0000 ug | SUBLINGUAL_TABLET | Freq: Every day | SUBLINGUAL | 3 refills | Status: AC
  Filled 2024-08-17: qty 180, 90d supply, fill #0
  Filled 2024-08-18: qty 100, 100d supply, fill #0

## 2024-08-17 MED ORDER — VITAMIN D (ERGOCALCIFEROL) 1.25 MG (50000 UNIT) PO CAPS
50000.0000 [IU] | ORAL_CAPSULE | ORAL | 0 refills | Status: AC
  Filled 2024-08-17: qty 12, 84d supply, fill #0

## 2024-08-18 ENCOUNTER — Other Ambulatory Visit (HOSPITAL_COMMUNITY): Payer: Self-pay

## 2024-08-18 ENCOUNTER — Other Ambulatory Visit: Payer: Self-pay

## 2024-08-19 ENCOUNTER — Other Ambulatory Visit: Payer: Self-pay

## 2024-08-20 ENCOUNTER — Other Ambulatory Visit: Payer: Self-pay

## 2024-09-14 DIAGNOSIS — H401111 Primary open-angle glaucoma, right eye, mild stage: Secondary | ICD-10-CM | POA: Diagnosis not present

## 2024-09-14 DIAGNOSIS — H401122 Primary open-angle glaucoma, left eye, moderate stage: Secondary | ICD-10-CM | POA: Diagnosis not present

## 2024-09-17 ENCOUNTER — Other Ambulatory Visit (HOSPITAL_COMMUNITY): Payer: Self-pay

## 2024-09-17 ENCOUNTER — Other Ambulatory Visit: Payer: Self-pay

## 2025-02-05 ENCOUNTER — Ambulatory Visit: Admitting: Family Medicine

## 2025-08-16 ENCOUNTER — Encounter: Admitting: Family Medicine
